# Patient Record
Sex: Female | Born: 1980 | Hispanic: Yes | Marital: Single | Smoking: Never smoker
Health system: Southern US, Community
[De-identification: ages and names within clinical notes are randomized; demographics above are authoritative.]

## PROBLEM LIST (undated history)

## (undated) ENCOUNTER — Encounter

## (undated) ENCOUNTER — Ambulatory Visit

## (undated) ENCOUNTER — Ambulatory Visit: Payer: MEDICAID

## (undated) ENCOUNTER — Telehealth

## (undated) ENCOUNTER — Encounter: Attending: Internal Medicine | Primary: Internal Medicine

## (undated) ENCOUNTER — Ambulatory Visit
Attending: Rehabilitative and Restorative Service Providers" | Primary: Rehabilitative and Restorative Service Providers"

## (undated) ENCOUNTER — Encounter: Payer: MEDICARE | Attending: Obstetrics & Gynecology | Primary: Obstetrics & Gynecology

## (undated) ENCOUNTER — Encounter
Attending: Student in an Organized Health Care Education/Training Program | Primary: Student in an Organized Health Care Education/Training Program

## (undated) ENCOUNTER — Ambulatory Visit: Attending: Internal Medicine | Primary: Internal Medicine

## (undated) ENCOUNTER — Encounter: Payer: PRIVATE HEALTH INSURANCE | Attending: Obstetrics & Gynecology | Primary: Obstetrics & Gynecology

## (undated) DIAGNOSIS — R51 Headache: Secondary | ICD-10-CM

## (undated) DIAGNOSIS — F32A Depression, unspecified: Secondary | ICD-10-CM

## (undated) DIAGNOSIS — R519 Headache, unspecified: Secondary | ICD-10-CM

## (undated) DIAGNOSIS — O24419 Gestational diabetes mellitus in pregnancy, unspecified control: Secondary | ICD-10-CM

## (undated) DIAGNOSIS — F329 Major depressive disorder, single episode, unspecified: Secondary | ICD-10-CM

## (undated) HISTORY — DX: Gestational diabetes mellitus in pregnancy, unspecified control: O24.419

## (undated) HISTORY — PX: FRACTURE SURGERY: SHX138

## (undated) HISTORY — PX: ANKLE FRACTURE SURGERY: SHX122

---
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
## 2013-04-14 DIAGNOSIS — R000 Tachycardia, unspecified: Secondary | ICD-10-CM

## 2013-05-02 DIAGNOSIS — Z331 Pregnant state, incidental: Secondary | ICD-10-CM | POA: Insufficient documentation

## 2014-10-02 ENCOUNTER — Ambulatory Visit (INDEPENDENT_AMBULATORY_CARE_PROVIDER_SITE_OTHER): Payer: Medicaid Other | Admitting: Obstetrics and Gynecology

## 2014-10-02 VITALS — BP 100/64 | HR 85 | Wt 164.1 lb

## 2014-10-02 DIAGNOSIS — O219 Vomiting of pregnancy, unspecified: Secondary | ICD-10-CM

## 2014-10-02 DIAGNOSIS — N912 Amenorrhea, unspecified: Secondary | ICD-10-CM

## 2014-10-02 DIAGNOSIS — Z113 Encounter for screening for infections with a predominantly sexual mode of transmission: Secondary | ICD-10-CM

## 2014-10-02 DIAGNOSIS — Z3491 Encounter for supervision of normal pregnancy, unspecified, first trimester: Secondary | ICD-10-CM

## 2014-10-02 DIAGNOSIS — Z3687 Encounter for antenatal screening for uncertain dates: Secondary | ICD-10-CM

## 2014-10-02 LAB — POCT URINE PREGNANCY: Preg Test, Ur: POSITIVE — AB

## 2014-10-02 MED ORDER — DOXYLAMINE-PYRIDOXINE 10-10 MG PO TBEC: 1.0000 | DELAYED_RELEASE_TABLET | Freq: Four times a day (QID) | ORAL | Status: DC | PRN

## 2014-10-02 NOTE — Progress Notes (Signed)
Vic Blackbird for NOB nurse interview visit. G4  P1021-. Pt did not bring in her confirmation from [facility]. UPT done in office. (pos) Pregnancy education material explained and given. NOB labs ordered. HIV consent form signed. PNV encouraged. Drug screen obtained. NT declined for now will discus  with provider. Pt c/o of nausea. Diclegis rx sent in and instructions given. Pt is unsure of lmp. Dating scan ordered asap. Pt aware if she is greater than 10 weeks she will need to be sooner than 4 weeks.  Pt. To follow up with provider in 4 +/- weeks for NOB physical.  All questions answered.

## 2014-10-02 NOTE — Progress Notes (Signed)
ZIKA EXPOSURE SCREEN:  The patient has not traveled to a Zika Virus endemic area within the past 6 months, nor has she had unprotected sex with a partner who has travelled to a Zika endemic region within the past 6 months. The patient has been advised to notify us if these factors change any time during this current pregnancy, so adequate testing and monitoring can be initiated.  

## 2014-10-04 LAB — URINALYSIS, ROUTINE W REFLEX MICROSCOPIC
Bilirubin, UA: NEGATIVE
Glucose, UA: NEGATIVE
KETONES UA: NEGATIVE
Leukocytes, UA: NEGATIVE
Nitrite, UA: NEGATIVE
PROTEIN UA: NEGATIVE
RBC, UA: NEGATIVE
Specific Gravity, UA: 1.025 (ref 1.005–1.030)
UUROB: 0.2 mg/dL (ref 0.2–1.0)
pH, UA: 6 (ref 5.0–7.5)

## 2014-10-04 LAB — GC/CHLAMYDIA PROBE AMP
Chlamydia trachomatis, NAA: NEGATIVE
NEISSERIA GONORRHOEAE BY PCR: NEGATIVE

## 2014-10-05 LAB — URINE CULTURE

## 2014-10-06 ENCOUNTER — Ambulatory Visit: Payer: Medicaid Other

## 2014-10-06 ENCOUNTER — Other Ambulatory Visit: Payer: Medicaid Other

## 2014-10-06 DIAGNOSIS — Z36 Encounter for antenatal screening of mother: Secondary | ICD-10-CM | POA: Diagnosis not present

## 2014-10-07 LAB — CBC WITH DIFFERENTIAL/PLATELET
BASOS ABS: 0 10*3/uL (ref 0.0–0.2)
BASOS: 0 %
EOS (ABSOLUTE): 0.1 10*3/uL (ref 0.0–0.4)
EOS: 1 %
Hematocrit: 37.9 % (ref 34.0–46.6)
Hemoglobin: 12.9 g/dL (ref 11.1–15.9)
Immature Grans (Abs): 0 10*3/uL (ref 0.0–0.1)
Immature Granulocytes: 0 %
Lymphocytes Absolute: 2.1 10*3/uL (ref 0.7–3.1)
Lymphs: 25 %
MCH: 30.2 pg (ref 26.6–33.0)
MCHC: 34 g/dL (ref 31.5–35.7)
MCV: 89 fL (ref 79–97)
MONOCYTES: 11 %
Monocytes Absolute: 1 10*3/uL — ABNORMAL HIGH (ref 0.1–0.9)
Neutrophils Absolute: 5.5 10*3/uL (ref 1.4–7.0)
Neutrophils: 63 %
Platelets: 230 10*3/uL (ref 150–379)
RBC: 4.27 x10E6/uL (ref 3.77–5.28)
RDW: 14.3 % (ref 12.3–15.4)
WBC: 8.7 10*3/uL (ref 3.4–10.8)

## 2014-10-07 LAB — VARICELLA ZOSTER ANTIBODY, IGG: Varicella zoster IgG: 1520 index (ref >165)

## 2014-10-07 LAB — HIV ANTIBODY (ROUTINE TESTING W REFLEX): HIV Screen 4th Generation wRfx: NONREACTIVE

## 2014-10-07 LAB — ANTIBODY SCREEN: ANTIBODY SCREEN: NEGATIVE

## 2014-10-07 LAB — RUBELLA SCREEN: Rubella Antibodies, IGG: 2.67 index (ref >0.99)

## 2014-10-07 LAB — RPR: RPR: NONREACTIVE

## 2014-10-07 LAB — ABO AND RH: RH TYPE: POSITIVE

## 2014-10-07 LAB — HEPATITIS B SURFACE ANTIGEN: HEP B S AG: NEGATIVE

## 2014-10-09 ENCOUNTER — Encounter: Payer: Self-pay | Admitting: Obstetrics and Gynecology

## 2014-10-21 ENCOUNTER — Encounter: Payer: Self-pay | Admitting: Obstetrics and Gynecology

## 2014-10-21 NOTE — Progress Notes (Signed)
I agree with assessment and documentation as noted .   Hildred LaserAnika Marieke Lubke, MD [facility]

## 2014-11-09 ENCOUNTER — Encounter: Payer: Medicaid Other | Admitting: Obstetrics and Gynecology

## 2014-11-17 ENCOUNTER — Ambulatory Visit (INDEPENDENT_AMBULATORY_CARE_PROVIDER_SITE_OTHER): Payer: Medicaid Other | Admitting: Obstetrics and Gynecology

## 2014-11-17 ENCOUNTER — Other Ambulatory Visit: Payer: Self-pay | Admitting: *Deleted

## 2014-11-17 ENCOUNTER — Encounter: Payer: Self-pay | Admitting: Obstetrics and Gynecology

## 2014-11-17 VITALS — BP 99/66 | HR 87 | Ht 60.0 in | Wt 168.0 lb

## 2014-11-17 DIAGNOSIS — O09529 Supervision of elderly multigravida, unspecified trimester: Secondary | ICD-10-CM | POA: Insufficient documentation

## 2014-11-17 DIAGNOSIS — O99210 Obesity complicating pregnancy, unspecified trimester: Secondary | ICD-10-CM | POA: Insufficient documentation

## 2014-11-17 DIAGNOSIS — O09291 Supervision of pregnancy with other poor reproductive or obstetric history, first trimester: Secondary | ICD-10-CM | POA: Diagnosis not present

## 2014-11-17 DIAGNOSIS — O34219 Maternal care for unspecified type scar from previous cesarean delivery: Secondary | ICD-10-CM

## 2014-11-17 DIAGNOSIS — Z1389 Encounter for screening for other disorder: Secondary | ICD-10-CM

## 2014-11-17 DIAGNOSIS — O09299 Supervision of pregnancy with other poor reproductive or obstetric history, unspecified trimester: Secondary | ICD-10-CM | POA: Insufficient documentation

## 2014-11-17 DIAGNOSIS — O3421 Maternal care for scar from previous cesarean delivery: Secondary | ICD-10-CM

## 2014-11-17 DIAGNOSIS — O09521 Supervision of elderly multigravida, first trimester: Secondary | ICD-10-CM

## 2014-11-17 LAB — POCT URINALYSIS DIPSTICK
BILIRUBIN UA: NEGATIVE
Blood, UA: NEGATIVE
Glucose, UA: NEGATIVE
KETONES UA: NEGATIVE
Leukocytes, UA: NEGATIVE
Nitrite, UA: NEGATIVE
Protein, UA: NEGATIVE
Spec Grav, UA: 1.015
Urobilinogen, UA: NEGATIVE
pH, UA: 6.5

## 2014-11-17 MED ORDER — COMPLETENATE 29-1 MG PO CHEW: 1.0000 | CHEWABLE_TABLET | Freq: Every day | ORAL | Status: DC

## 2014-11-17 NOTE — Progress Notes (Signed)
Subjective:    Stacy Best is being seen today for her first obstetrical visit.  This is a planned pregnancy. She is at [redacted]w[redacted]d gestation. Patient's last menstrual period was 08/20/2014 (approximate).  Estimated Date of Delivery: 05/27/15 (consistent with 6 week sono).  Her obstetrical history is significant for advanced maternal age, obesity and history of prior C-section. Relationship with FOB: spouse, living together. Patient does intend to breast feed. Pregnancy history fully reviewed.  Menstrual History: Obstetric History   G4   P1   T1   P0   A2   TAB1   SAB1   E0   M0   L1     # Outcome Date GA Lbr Len/2nd Weight Sex Delivery Anes PTL Lv  4 Current           3 Term 2015   9 lb (4.082 kg) F CS-Unspec   Y     Name: [name]     Complications: Tachycardia  2 TAB 2003          1 SAB 2000             Reports normal pap smear in 2015 with last pregnancy.  Denies h/o abnormal pap smears. Denies h/o STIs.    Past Medical History  Diagnosis Date  . Medical history non-contributory   . Need for Tdap vaccination     28 weeks    Past Surgical History  Procedure Laterality Date  . Cesarean section  2015  . Ankle fracture surgery      Family History  Problem Relation Age of Onset  . Breast cancer Mother 43  . Diabetes Father   . Ovarian cancer Neg Hx   . Colon cancer Neg Hx   . Heart disease Neg Hx     History   Social History  . Marital Status: Single    Spouse Name: N/A  . Number of Children: N/A  . Years of Education: N/A   Occupational History  . Not on file.   Social History Main Topics  . Smoking status: Never Smoker   . Smokeless tobacco: Not on file  . Alcohol Use: No  . Drug Use: No  . Sexual Activity: Yes   Other Topics Concern  . Not on file   Social History Narrative    Outpatient Encounter Prescriptions as of 11/17/2014  Medication Sig  . Prenatal vitamin w/FE, FA (NATACHEW) 29-1 MG CHEW chewable tablet Chew 1 tablet by mouth daily at 12 noon.     No Known Allergies   Review of Systems General:Not Present- Fever, Weight Loss and Weight Gain. Skin:Not Present- Rash. HEENT:Not Present- Blurred Vision, Headache and Bleeding Gums. Respiratory:Not Present- Difficulty Breathing. Breast:Not Present- Breast Mass. Cardiovascular:Not Present- Chest Pain, Elevated Blood Pressure, Fainting / Blacking Out and Shortness of Breath. Gastrointestinal:Not Present- Abdominal Pain, Constipation, Nausea and Vomiting. Female Genitourinary:Not Present- Frequency, Painful Urination, Pelvic Pain, Vaginal Bleeding, Vaginal Discharge, Contractions, regular, Fetal Movements Decreased, Urinary Complaints and Vaginal Fluid. Musculoskeletal:Not Present- Back Pain and Leg Cramps. Neurological:Not Present- Dizziness. Psychiatric:Not Present- Depression.    Objective:   Blood pressure 99/66, pulse 87, height 5' (1.524 m), weight 168 lb (76.204 kg), last menstrual period 08/20/2014.   General Appearance:    Alert, cooperative, no distress, appears stated age  Head:    Normocephalic, without obvious abnormality, atraumatic  Eyes:    PERRL, conjunctiva/corneas clear, EOM's intact, both eyes  Ears:    Normal external ear canals, both ears  Nose:  Nares normal, septum midline, mucosa normal, no drainage or sinus tenderness  Throat:   Lips, mucosa, and tongue normal; teeth and gums normal  Neck:   Supple, symmetrical, trachea midline, no adenopathy; thyroid: no enlargement/tenderness/nodules; no       carotid bruit or JVD  Back:     Symmetric, no curvature, ROM normal, no CVA tenderness  Lungs:     Clear to auscultation bilaterally, respirations unlabored  Chest Wall:    No tenderness or deformity   Heart:    Regular rate and rhythm, S1 and S2 normal, no murmur, rub or gallop  Breast Exam:    No tenderness, masses, or nipple abnormality  Abdomen:     Soft, non-tender, bowel sounds active all four quadrants, no masses, no organomegaly.  FHT 156 bpm.   Faint low transverse incision scar.  Genitalia:    Pelvic:external genitalia normal, vagina with lesions, discharge, or tenderness, rectovaginal septum       normal. Cervix normal in appearance, no cervical motion tenderness, no adnexal masses or tenderness.     Pregnancy positive findings: uterine enlargement: 12 wk size, nontender.   Rectal:    Normal external sphincter.  No hemorrhoids appreciated. Internal exam not done.   Extremities:   Extremities normal, atraumatic, no cyanosis or edema  Pulses:   2+ and symmetric all extremities  Skin:   Skin color, texture, turgor normal, no rashes or lesions  Lymph nodes:   Cervical, supraclavicular, and axillary nodes normal  Neurologic:   CNII-XII intact, normal strength, sensation and reflexes throughout     Assessment:   Pregnancy at 12 and 5/7 weeks   Obesity H/o prior C-section x 1 Advanced maternal age   Plan:    Initial labs reviewed, normal. Prenatal vitamins. Problem list reviewed and updated. AFP3 discussed: undecided. Role of ultrasound in pregnancy discussed; fetal survey: to order at appropriate gestational age. Will need early glucola for obesity.  Discussed repeat C-section vs TOLAC, including patient's risks, risk factors.  Patient desires to to think over options.  Follow up in 4 weeks. New OB counseling: The patient has been given an overview regarding routine prenatal care.  Recommendations regarding diet, weight gain, and exercise in pregnancy were given.  Prenatal testing, optional genetic testing, and ultrasound use in pregnancy were reviewed. Benefits of Breast Feeding were discussed. The patient is encouraged to consider nursing her baby post partum.   Hildred Laser, MD [facility]

## 2014-12-20 ENCOUNTER — Ambulatory Visit (INDEPENDENT_AMBULATORY_CARE_PROVIDER_SITE_OTHER): Payer: Medicaid Other | Admitting: Obstetrics and Gynecology

## 2014-12-20 VITALS — BP 89/58 | HR 79 | Wt 170.2 lb

## 2014-12-20 DIAGNOSIS — O3421 Maternal care for scar from previous cesarean delivery: Secondary | ICD-10-CM

## 2014-12-20 DIAGNOSIS — Z3402 Encounter for supervision of normal first pregnancy, second trimester: Secondary | ICD-10-CM

## 2014-12-20 DIAGNOSIS — Z131 Encounter for screening for diabetes mellitus: Secondary | ICD-10-CM

## 2014-12-20 DIAGNOSIS — O34219 Maternal care for unspecified type scar from previous cesarean delivery: Secondary | ICD-10-CM

## 2014-12-20 DIAGNOSIS — O99210 Obesity complicating pregnancy, unspecified trimester: Secondary | ICD-10-CM

## 2014-12-20 DIAGNOSIS — O09522 Supervision of elderly multigravida, second trimester: Secondary | ICD-10-CM

## 2014-12-20 LAB — POCT URINALYSIS DIPSTICK
Bilirubin, UA: NEGATIVE
Blood, UA: NEGATIVE
GLUCOSE UA: NEGATIVE
Ketones, UA: NEGATIVE
NITRITE UA: NEGATIVE
SPEC GRAV UA: 1.01
Urobilinogen, UA: NEGATIVE
pH, UA: 7.5

## 2014-12-20 NOTE — Progress Notes (Signed)
ROB: Patient complains of occasional difficulty staying asleep, feels tired during the day. Discussed herbal teas, Benadryl, Unisom. Undecided regarding flu vaccine today. Still undecided about TOLAC vs repeat CS.  To discuss again next visit.  For quad screen and early glucola today.  RTC in 4 weeks. For anatomy scan at that time.

## 2014-12-21 LAB — HEMOGLOBIN AND HEMATOCRIT, BLOOD
Hematocrit: 35.7 % (ref 34.0–46.6)
Hemoglobin: 12.2 g/dL (ref 11.1–15.9)

## 2014-12-21 LAB — GLUCOSE, 1 HOUR GESTATIONAL: Gestational Diabetes Screen: 156 mg/dL — ABNORMAL HIGH (ref 65–139)

## 2014-12-22 ENCOUNTER — Other Ambulatory Visit: Payer: Self-pay

## 2014-12-22 ENCOUNTER — Telehealth: Payer: Self-pay

## 2014-12-22 DIAGNOSIS — Z3402 Encounter for supervision of normal first pregnancy, second trimester: Secondary | ICD-10-CM

## 2014-12-22 DIAGNOSIS — R7309 Other abnormal glucose: Secondary | ICD-10-CM

## 2014-12-22 NOTE — Telephone Encounter (Signed)
-----   Message from Hildred Laser, MD sent at 12/21/2014  3:11 PM EDT ----- Please inform patient that early glucola screen is elevated, needs 3 hr GTT

## 2014-12-22 NOTE — Telephone Encounter (Signed)
-----   Message from Stacy Cherry, MD sent at 12/21/2014  3:11 PM EDT ----- Please inform patient that early glucola screen is elevated, needs 3 hr GTT 

## 2014-12-22 NOTE — Telephone Encounter (Signed)
Pt informed, and transferred to the front to make appt on the lab schedule.

## 2014-12-27 ENCOUNTER — Other Ambulatory Visit: Payer: Medicaid Other

## 2014-12-28 LAB — GESTATIONAL GLUCOSE TOLERANCE
GLUCOSE 1 HOUR GTT: 205 mg/dL — AB (ref 65–179)
GLUCOSE 2 HOUR GTT: 185 mg/dL — AB (ref 65–154)
GLUCOSE FASTING: 90 mg/dL (ref 65–94)
Glucose, GTT - 3 Hour: 130 mg/dL (ref 65–139)

## 2015-01-01 ENCOUNTER — Telehealth: Payer: Self-pay

## 2015-01-01 DIAGNOSIS — R7309 Other abnormal glucose: Secondary | ICD-10-CM

## 2015-01-01 NOTE — Telephone Encounter (Signed)
-----   Message from Hildred Laser, MD sent at 01/01/2015  7:28 AM EDT ----- Please inform patient of abnormal 3 hr GTT.  Needs referral to [facility] for Diabetes management.

## 2015-01-01 NOTE — Telephone Encounter (Signed)
Pt aware. Referral ordered.  

## 2015-01-09 ENCOUNTER — Encounter: Payer: Self-pay | Admitting: *Deleted

## 2015-01-09 ENCOUNTER — Encounter: Payer: Medicaid Other | Attending: Obstetrics and Gynecology | Admitting: *Deleted

## 2015-01-09 VITALS — BP 90/60 | Ht 60.0 in | Wt 173.6 lb

## 2015-01-09 DIAGNOSIS — O24419 Gestational diabetes mellitus in pregnancy, unspecified control: Secondary | ICD-10-CM | POA: Diagnosis not present

## 2015-01-09 DIAGNOSIS — O24410 Gestational diabetes mellitus in pregnancy, diet controlled: Secondary | ICD-10-CM

## 2015-01-10 ENCOUNTER — Other Ambulatory Visit: Payer: Self-pay | Admitting: *Deleted

## 2015-01-10 ENCOUNTER — Telehealth: Payer: Self-pay | Admitting: Obstetrics and Gynecology

## 2015-01-10 MED ORDER — GLUCOSE BLOOD VI STRP: ORAL_STRIP | Status: DC

## 2015-01-10 MED ORDER — ACCU-CHEK FASTCLIX LANCETS MISC: 1.0000 | Freq: Four times a day (QID) | Status: DC

## 2015-01-10 NOTE — Patient Instructions (Signed)
Read booklet on Gestational Diabetes Follow Gestational Meal Planning Guidelines Complete a 3 Day Food Record and bring to next appointment Check blood sugars 4 x day - before breakfast and 2 hrs after every meal and record  Call MD for prescription for meter strips and lancets Strips   Accu-Chek Aviva Lancets   Accu-Chek Fastclix Bring blood sugar log to next appointment Purchase urine ketone strips if blood sugars not controlled Check urine ketones every am:  If + increase bedtime snack to 1 protein and 2 carbohydrate servings Walk 20-30 minutes at least 5 x week if permitted by MD Avoid sugar sweetened beverages Limit desserts/sweets

## 2015-01-10 NOTE — Progress Notes (Signed)
Diabetes Self-Management Education  Visit Type: First/Initial  Appt. Start Time: 1445 Appt. End Time: 1630  01/10/2015  [name], identified by name and date of birth, is a 34 y.o. female with a diagnosis of Diabetes: Gestational Diabetes.   ASSESSMENT  Blood pressure 90/60, height 5' (1.524 m), weight 173 lb 9.6 oz (78.744 kg), last menstrual period 08/20/2014. Body mass index is 33.9 kg/(m^2).      Diabetes Self-Management Education - 01/09/15 1736    Visit Information   Visit Type First/Initial   Initial Visit   Diabetes Type Gestational Diabetes   Are you currently following a meal plan? Yes   What type of meal plan do you follow? "eat more since my pregnancy"   Are you taking your medications as prescribed? Yes   Date Diagnosed 2 weeks ago   Health Coping   How would you rate your overall health? Fair   Psychosocial Assessment   Patient Belief/Attitude about Diabetes Afraid  "scary, nervous"   Self-care barriers None   Self-management support Doctor's office;Family   Patient Concerns Nutrition/Meal planning;Monitoring;Glycemic Control;Other (comment);Healthy Lifestyle;Weight Control  "feel better emotionally - self esteem"   Special Needs None   Preferred Learning Style Auditory;Visual;Hands on   Learning Readiness Ready   How often do you need to have someone help you when you read instructions, pamphlets, or other written materials from your doctor or pharmacy? 1 - Never   What is the last grade level you completed in school? [employer]   Complications   How often do you check your blood sugar? 0 times/day (not testing)  Provided Accu-Chek Aviva Connect meter and instructed on use. BG upon return demonstration was 104 mg/dL at 0:98 pm - 3 hrs pp.   Have you had a dilated eye exam in the past 12 months? No   Have you had a dental exam in the past 12 months? No   Are you checking your feet? No   Dietary Intake   Breakfast oatmeal, shake, bread - bagel,  biscuit or muffin   Snack (morning) fruit, bread   Lunch meat, rice   Snack (afternoon) nuts, ice cream   Dinner meat, rice, soup   Snack (evening) milk   Beverage(s) water, regular soda   Exercise   Exercise Type ADL's   Patient Education   Previous Diabetes Education No   Disease state  Definition of diabetes, type 1 and 2, and the diagnosis of diabetes;Factors that contribute to the development of diabetes   Nutrition management  Role of diet in the treatment of diabetes and the relationship between the three main macronutrients and blood glucose level;Food label reading, portion sizes and measuring food.   Physical activity and exercise  Role of exercise on diabetes management, blood pressure control and cardiac health.   Monitoring Taught/evaluated SMBG meter.;Purpose and frequency of SMBG.;Identified appropriate SMBG and/or A1C goals.   Chronic complications Relationship between chronic complications and blood glucose control   Psychosocial adjustment Identified and addressed patients feelings and concerns about diabetes   Preconception care Pregnancy and GDM  Role of pre-pregnancy blood glucose control on the development of the fetus;Reviewed with patient blood glucose goals with pregnancy   Individualized Goals (developed by patient)   Reducing Risk Improve blood sugars Lose weight Lead a healthier lifestyle Become more fit Feel better emotionally - self esteem   Outcomes   Expected Outcomes Demonstrated interest in learning. Expect positive outcomes      Individualized Plan for Diabetes Self-Management  Training:   Learning Objective:  Patient will have a greater understanding of diabetes self-management. Patient education plan is to attend individual and/or group sessions per assessed needs and concerns.   Plan:   Patient Instructions  Read booklet on Gestational Diabetes Follow Gestational Meal Planning Guidelines Complete a 3 Day Food Record and bring to next  appointment Check blood sugars 4 x day - before breakfast and 2 hrs after every meal and record  Call MD for prescription for meter strips and lancets Strips   Accu-Chek Aviva Lancets   Accu-Chek Fastclix Bring blood sugar log to next appointment Purchase urine ketone strips if blood sugars not controlled Check urine ketones every am:  If + increase bedtime snack to 1 protein and 2 carbohydrate servings Walk 20-30 minutes at least 5 x week if permitted by MD Avoid sugar sweetened beverages Limit desserts/sweets   Expected Outcomes:  Demonstrated interest in learning. Expect positive outcomes  Education material provided:  Gestational Meal Planning Guidelines Viewed Gestational Diabetes Video Meter - Accu-Chek Aviva Connect 3 Day Food Record Goals for a Healthy Pregnancy  If problems or questions, patient to contact team via:   Sharion Settler, RN, CCM, CDE [phone]  Future DSME appointment:  January 26, 2015 with Dietitian

## 2015-01-10 NOTE — Telephone Encounter (Signed)
THIS IS A CHERRY PT, SHE SAW DIETICIAN YESTERDAY AND WAS GIVE AN ACCU CHECK GLUCOMETER, SHE NEEDS STRIPS AND LANCETS SENT TO [facility] [place], ALSO SAID IF SHE COULD GET ALCOHOL WIPES AND GUAZE BY PRESCRIPTION PLEASE DO THAT TOO.

## 2015-01-10 NOTE — Telephone Encounter (Signed)
rx sent in 

## 2015-01-16 ENCOUNTER — Telehealth: Payer: Self-pay | Admitting: Obstetrics and Gynecology

## 2015-01-16 NOTE — Telephone Encounter (Signed)
PT THINKS SHE MAY HAVE AN INF... LOT OF SCHARGE, BURNING , ITCHING, WANTED AN APPT TODAY BUT THERE IS NOTHING UNTIL Thursday, SHE SAID SHE A NOTE.

## 2015-01-16 NOTE — Telephone Encounter (Signed)
Pt also has vaginal odor. Just left pcp and was found to have BV and Metrodiaole  po 2xd, x7d. Pt states she had let this go and knew we were busy. Very pleasant.

## 2015-01-18 ENCOUNTER — Encounter: Payer: Medicaid Other | Attending: Obstetrics and Gynecology | Admitting: Dietician

## 2015-01-18 VITALS — BP 98/50 | Ht 60.0 in | Wt 172.5 lb

## 2015-01-18 DIAGNOSIS — O24410 Gestational diabetes mellitus in pregnancy, diet controlled: Secondary | ICD-10-CM

## 2015-01-18 DIAGNOSIS — O24419 Gestational diabetes mellitus in pregnancy, unspecified control: Secondary | ICD-10-CM | POA: Diagnosis present

## 2015-01-18 NOTE — Patient Instructions (Signed)
   Advised patient to include protein source with all meals, and discussed options.  Advised no more than 3 servings or 45g carbohydrate with meals.   Encouraged patient to proceed with plans for exercise as means for helping improve BGs.

## 2015-01-18 NOTE — Progress Notes (Signed)
   Patient's BG record indicates BGs often elevated: FBGs ranging 98-116; post-meal BGs 101-167.   Patient's food diary indicates several fast food/ restaurant meals, which patient states is out of the ordinary.    Provided 1700kcal meal plan with instruction, and wrote individualized menus based on patient's food preferences.  Instructed patient on food safety, including avoidance of Listeriosis, and limiting mercury from fish.  Discussed importance of maintaining healthy lifestyle habits to reduce risk of Type 2 DM as well as Gestational DM with any future pregnancies.  Advised patient to use any remaining testing supplies to test some BGs after delivery, and to have BG tested ideally annually, as well as prior to attempting future pregnancies.

## 2015-01-19 ENCOUNTER — Ambulatory Visit: Payer: Medicaid Other

## 2015-01-19 DIAGNOSIS — Z3402 Encounter for supervision of normal first pregnancy, second trimester: Secondary | ICD-10-CM | POA: Diagnosis not present

## 2015-01-23 ENCOUNTER — Ambulatory Visit (INDEPENDENT_AMBULATORY_CARE_PROVIDER_SITE_OTHER): Payer: Medicaid Other | Admitting: Obstetrics and Gynecology

## 2015-01-23 VITALS — BP 113/57 | HR 87 | Wt 174.5 lb

## 2015-01-23 DIAGNOSIS — O09522 Supervision of elderly multigravida, second trimester: Secondary | ICD-10-CM

## 2015-01-23 DIAGNOSIS — O99210 Obesity complicating pregnancy, unspecified trimester: Secondary | ICD-10-CM

## 2015-01-23 DIAGNOSIS — R809 Proteinuria, unspecified: Secondary | ICD-10-CM

## 2015-01-23 DIAGNOSIS — O34219 Maternal care for unspecified type scar from previous cesarean delivery: Secondary | ICD-10-CM

## 2015-01-23 DIAGNOSIS — O24419 Gestational diabetes mellitus in pregnancy, unspecified control: Secondary | ICD-10-CM

## 2015-01-23 DIAGNOSIS — Z3402 Encounter for supervision of normal first pregnancy, second trimester: Secondary | ICD-10-CM

## 2015-01-23 LAB — POCT URINALYSIS DIP (MANUAL ENTRY)
BILIRUBIN UA: NEGATIVE
Glucose, UA: NEGATIVE
Nitrite, UA: NEGATIVE
PH UA: 6
RBC UA: NEGATIVE
UROBILINOGEN UA: 0.2

## 2015-01-23 MED ORDER — FLUCONAZOLE 150 MG PO TABS: 150.0000 mg | ORAL_TABLET | Freq: Once | ORAL | Status: DC

## 2015-01-23 MED ORDER — PRENATAL MULTIVITAMIN PLUS DHA 27-0.8-250 MG PO CAPS: 1.0000 | ORAL_CAPSULE | Freq: Every day | ORAL | Status: DC

## 2015-01-23 NOTE — Patient Instructions (Addendum)
Trial of Labor After Cesarean Delivery Information A trial of labor after cesarean delivery (TOLAC) is when a woman tries to give birth vaginally after a previous cesarean delivery. TOLAC may be a safe and appropriate option for you depending on your medical history and other risk factors. When TOLAC is successful and you are able to have a vaginal delivery, this is called a vaginal birth after cesarean delivery (VBAC).  CANDIDATES FOR TOLAC TOLAC is possible for some women who:  Have undergone one or two prior cesarean deliveries in which the incision of the uterus was horizontal (low transverse).  Are carrying twins and have had one prior low transverse incision during a cesarean delivery.  Do not have a vertical (classical) uterine scar.  Have not had a tear in the wall of their uterus (uterine rupture). TOLAC is also supported for women who meet appropriate criteria and:  Are under the age of 40 years.  Are tall and have a body mass index (BMI) of less than 30.  Have an unknown uterine scar.  Give birth in a facility equipped to handle an emergency cesarean delivery. This team should be able to handle possible complications such as a uterine rupture.  Have thorough counseling about the benefits and risks of TOLAC.  Have discussed future pregnancy plans with their health care provider.  Plan to have several more pregnancies. MOST SUCCESSFUL CANDIDATES FOR TOLAC:  Have had a successful vaginal delivery before or after their cesarean delivery.  Experience labor that begins naturally on or before the due date (40 weeks of gestation).  Do not have a very large (macrosomic) baby.   Had a prior cesarean delivery but are not currently experiencing factors that would prompt a cesarean delivery (such as a breech position).  Had only one prior cesarean delivery.  Had a prior cesarean delivery that was performed early in labor and not after full cervical dilation. TOLAC may be most  appropriate for women who meet the above guidelines and who plan to have more pregnancies. TOLAC is not recommended for home births. LEAST SUCCESSFUL CANDIDATES FOR TOLAC:  Have an induced labor with an unfavorable cervix. An unfavorable cervix is when the cervix is not dilating enough (among other factors).  Have never had a vaginal delivery.  Have had more than two cesarean deliveries.  Have a pregnancy at more than 40 weeks of gestation.  Are pregnant with a baby with a suspected weight greater than 4,000 grams (8 pounds) and who have no prior history of a vaginal delivery.  Have closely spaced pregnancies. SUGGESTED BENEFITS OF TOLAC  You may have a faster recovery time.  You may have a shorter stay in the hospital.  You may have less pain and fewer problems than with a cesarean delivery. Women who have a cesarean delivery have a higher chance of needing blood or getting a fever, an infection, or a blood clot in the legs. SUGGESTED RISKS OF TOLAC The highest risk of complications happens to women who attempt a TOLAC and fail. A failed TOLAC results in an unplanned cesarean delivery. Risks related to TOLAC or repeat cesarean deliveries include:   Blood loss.  Infection.  Blood clot.  Injury to surrounding tissues or organs.  Having to remove the uterus (hysterectomy).  Potential problems with the placenta (such as placenta previa or placenta accreta) in future pregnancies. Although very rare, the main concerns with TOLAC are:  Rupture of the uterine scar from a past cesarean delivery.  Needing an   emergency cesarean delivery.  Having a bad outcome for the baby (perinatal morbidity). FOR MORE INFORMATION [employer] of Obstetricians and Gynecologists: www.acog.org [employer]: www.midwife.org   This information is not intended to replace advice given to you by your health care provider. Make sure you discuss any questions you have with  your health care provider.   Document Released: 12/17/2010 Document Revised: 01/19/2013 Document Reviewed: 09/20/2012 Elsevier Interactive Patient Education [employer] Cesarean Delivery Cesarean delivery is the birth of a baby through a cut (incision) in the abdomen and womb (uterus).  LET [facility] CARE PROVIDER KNOW ABOUT:  All medicines you are taking, including vitamins, herbs, eye drops, creams, and over-the-counter medicines.  Previous problems you or members of your family have had with the use of anesthetics.  Any bleeding or blood clotting disorders you have.  Family history of blood clots or bleeding disorders.  Any history of deep vein thrombosis (DVT) or pulmonary embolism (PE).  Previous surgeries you have had.  Medical conditions you have.  Any allergies you have.  Complicationsinvolving the pregnancy. RISKS AND COMPLICATIONS  Generally, this is a safe procedure. However, as with any procedure, complications can occur. Possible complications include:  Bleeding.  Infection.  Blood clots.  Injury to surrounding organs.  Problems with anesthesia.  Injury to the baby. BEFORE THE PROCEDURE   You may be given an antacid medicine to drink. This will prevent acid contents in your stomach from going into your lungs if you vomit during the surgery.  You may be given an antibiotic medicine to prevent infection. PROCEDURE   To prevent infection of your incision:  Hair may be removed from your pubic area if it is near your incision.  The skin of your pubic area and lower abdomen will be cleaned with a germ-killing solution (antiseptic).  A tube (Foley catheter) will be placed in your bladder to drain your urine from your bladder into a bag. This keeps your bladder empty during surgery.  An IV tube will be placed in your vein.  You may be given medicine to numb the lower half of your body (regional anesthetic). If you were in labor, you may have  already had an epidural in place which can be used in both labor and cesarean delivery. You may possibly be given medicine to make you sleep (general anesthetic) though this is not as common.  Your heart rate and your baby's heart rate will be monitored.  An incision will be made in your abdomen that extends to your uterus. There are 2 basic kinds of incisions:  The horizontal (transverse) incision. Horizontal incisions are from side to side and are used for most routine cesarean deliveries.  The vertical incision. The vertical incision is from the top of the abdomen to the bottom and is less commonly used. It is often done for women who have a serious complication (extreme prematurity) or under emergency situations.  The horizontal and vertical incisions may both be used at the same time. However, this is very uncommon.  An incision is then made in your uterus to deliver the baby.  Your baby will be delivered.  Your health care provider may place the baby on your chest. It is important to keep the baby warm. Your health care provider will dry off the baby, place the baby directly on your bare skin, and cover the baby with warm, dry blankets.  Both incisions will be closed with absorbable stitches. AFTER THE PROCEDURE  If you were awake during the surgery, you will see your baby right away. If you were asleep, you will see your baby as soon as you are awake.  You may breastfeed your baby after surgery.  You may be able to get up and walk the same day as the surgery. If you need to stay in bed for a period of time, you will receive help to turn, cough, and take deep breaths after surgery. This helps prevent lung problems such as pneumonia.  Do not get out of bed alone the first time after surgery. You will need help getting out of bed until you are able to do this by yourself.  You may be able to shower the day after your cesarean delivery. After the bandage (dressing) is taken off the  incision site, a nurse will assist you to shower if you would like help.  You may be directed to take actions to help prevent blood clots in your legs. These may include:  Walking shortly after surgery, with someone assisting you. Moving around after surgery helps to improve blood flow.  Wearing compression stockings or using different types of devices.  Taking medicines to thin your blood (anticoagulants) if you are at high risk for DVT or PE.  Save any blood clots that you pass from your vagina. If you pass a clot while on the toilet, do not flush it. Call for the nurse. Tell the nurse if you think you are bleeding too much or passing too many clots.  You will be given medicine for pain and nausea as needed. Let your health care providers know if you are hurting. You may also be given an antibiotic to prevent an infection.  Your IV tube will be taken out when you are drinking a reasonable amount of fluids. The Foley catheter is taken out when you are up and walking.  If your blood type is Rh negative and your baby's blood type is Rh positive, you will be given a shot of anti-D immune globulin. This shot prevents you from having Rh problems with a future pregnancy. You should get the shot even if you had your tubes tied (tubal ligation).  If you are allowed to take the baby for a walk, place the baby in the bassinet and push it.   This information is not intended to replace advice given to you by your health care provider. Make sure you discuss any questions you have with your health care provider.   Document Released: 03/31/2005 Document Revised: 12/20/2014 Document Reviewed: 11/26/2011 Elsevier Interactive Patient Education [employer]

## 2015-01-23 NOTE — Progress Notes (Signed)
ROB: Doing well. Taking medication for BV infection. Reports return of discharge after several days.  Will prescribe Diflucan.  Has been to [facility] class for newly diagnosed GDM.  Notes several elevated blood sugars, however is working on managing diet better (admits to "cheating" sometimes with high glycemic meals/snacks). Is going to begin working out. Further discussion had on TOLAC vs C-section, patient still deciding, questions answered today.  Requests another handout on both.  Information given. S/p anatomy scan with incomplete anatomy.  Will have patient f/u in 2-3 weeks for anatomy scan, and 4 weeks for ROB.  If blood sugars still elevated by that time, will likely need to initiate medication at that time.

## 2015-01-26 ENCOUNTER — Ambulatory Visit: Payer: Medicaid Other | Admitting: Dietician

## 2015-01-27 LAB — URINE CULTURE

## 2015-02-09 ENCOUNTER — Ambulatory Visit: Payer: Medicaid Other

## 2015-02-09 DIAGNOSIS — Z3402 Encounter for supervision of normal first pregnancy, second trimester: Secondary | ICD-10-CM | POA: Diagnosis not present

## 2015-02-20 ENCOUNTER — Encounter: Payer: Medicaid Other | Admitting: Obstetrics and Gynecology

## 2015-02-20 ENCOUNTER — Ambulatory Visit (INDEPENDENT_AMBULATORY_CARE_PROVIDER_SITE_OTHER): Payer: Medicaid Other | Admitting: Obstetrics and Gynecology

## 2015-02-20 ENCOUNTER — Encounter: Payer: Self-pay | Admitting: Obstetrics and Gynecology

## 2015-02-20 VITALS — BP 91/66 | HR 90 | Wt 177.3 lb

## 2015-02-20 DIAGNOSIS — O24419 Gestational diabetes mellitus in pregnancy, unspecified control: Secondary | ICD-10-CM

## 2015-02-20 DIAGNOSIS — O09522 Supervision of elderly multigravida, second trimester: Secondary | ICD-10-CM

## 2015-02-20 DIAGNOSIS — Z3492 Encounter for supervision of normal pregnancy, unspecified, second trimester: Secondary | ICD-10-CM

## 2015-02-20 DIAGNOSIS — O99210 Obesity complicating pregnancy, unspecified trimester: Secondary | ICD-10-CM

## 2015-02-20 DIAGNOSIS — O34219 Maternal care for unspecified type scar from previous cesarean delivery: Secondary | ICD-10-CM

## 2015-02-20 LAB — POCT URINALYSIS DIPSTICK
Bilirubin, UA: NEGATIVE
GLUCOSE UA: NEGATIVE
Ketones, UA: NEGATIVE
LEUKOCYTES UA: NEGATIVE
Nitrite, UA: NEGATIVE
Protein, UA: NEGATIVE
RBC UA: NEGATIVE
Spec Grav, UA: 1.02
UROBILINOGEN UA: 0.2
pH, UA: 6.5

## 2015-02-20 MED ORDER — GLYBURIDE 2.5 MG PO TABS: 2.5000 mg | ORAL_TABLET | Freq: Two times a day (BID) | ORAL | Status: DC

## 2015-02-20 NOTE — Progress Notes (Signed)
No complaints- see u/s report. BS- pt did not bring- bs log from [facility] reviewed by mad.Patient reports fasting blood sugars still running slightly over 100; two-hour postprandials are in the 120-140 range.  I have recommended patient to go on glyburide 2.5 mg twice a day.  Return in 1 week for blood sugar review with Dr. Valentino Saxonherry.

## 2015-02-28 ENCOUNTER — Ambulatory Visit (INDEPENDENT_AMBULATORY_CARE_PROVIDER_SITE_OTHER): Payer: Medicaid Other | Admitting: Obstetrics and Gynecology

## 2015-02-28 VITALS — BP 112/63 | HR 92 | Wt 179.1 lb

## 2015-02-28 DIAGNOSIS — O99210 Obesity complicating pregnancy, unspecified trimester: Secondary | ICD-10-CM

## 2015-02-28 DIAGNOSIS — O09522 Supervision of elderly multigravida, second trimester: Secondary | ICD-10-CM

## 2015-02-28 DIAGNOSIS — Z3492 Encounter for supervision of normal pregnancy, unspecified, second trimester: Secondary | ICD-10-CM

## 2015-02-28 DIAGNOSIS — O34219 Maternal care for unspecified type scar from previous cesarean delivery: Secondary | ICD-10-CM

## 2015-02-28 DIAGNOSIS — O24410 Gestational diabetes mellitus in pregnancy, diet controlled: Secondary | ICD-10-CM

## 2015-02-28 LAB — POCT URINALYSIS DIPSTICK
BILIRUBIN UA: NEGATIVE
Glucose, UA: NEGATIVE
Ketones, UA: NEGATIVE
Leukocytes, UA: NEGATIVE
NITRITE UA: NEGATIVE
PH UA: 6.5
RBC UA: NEGATIVE
Spec Grav, UA: 1.02
Urobilinogen, UA: NEGATIVE

## 2015-03-02 NOTE — Progress Notes (Signed)
ROB: Patient notes that she has taken medication for BV, however still noting symptoms (vaginal discharge).  Denies itching/burning.  Vaginal exam wnl, discharge present, no odor.  Microscopic wet-mount exam shows negative for pathogens, normal epithelial cells. Advised on likely leukorrhea of pregnancy. Attempted to review blood sugars, however patient inconsistent with taking measurements.  Started on Glyburide 2.5 mg BID.  Unable to determine if medication needs to be increased based on results.  Recommend better compliance.  For H/H, Tdap, blood consents next visit.  To revisit discussion on mode of delivery at next visit. RTC in 1 week.

## 2015-03-13 ENCOUNTER — Telehealth: Payer: Self-pay | Admitting: Obstetrics and Gynecology

## 2015-03-13 NOTE — Telephone Encounter (Signed)
Called pt she states that her stomach has been "acting weird" Pt states that she was having normal bowel movements and then suddenly stools became loose. Pt states she is not having bowel movements daily, but maybe every other day. Pt denies pain, and states she is still having normal urination. Pt denies cramping or contractions. Advised pt on BRAT diet, and the use of Kaopectate or Imodium as needed. Pt advised to stay hydrated. Pt to f/u on appt already scheduled for Friday.

## 2015-03-13 NOTE — Telephone Encounter (Signed)
Her digestion ahas not been normal for 2 wks. (diahhrea)

## 2015-03-15 ENCOUNTER — Ambulatory Visit (INDEPENDENT_AMBULATORY_CARE_PROVIDER_SITE_OTHER): Payer: Medicaid Other | Admitting: Obstetrics and Gynecology

## 2015-03-15 VITALS — BP 110/68 | HR 92 | Wt 177.7 lb

## 2015-03-15 DIAGNOSIS — O09522 Supervision of elderly multigravida, second trimester: Secondary | ICD-10-CM

## 2015-03-15 DIAGNOSIS — O24410 Gestational diabetes mellitus in pregnancy, diet controlled: Secondary | ICD-10-CM

## 2015-03-15 DIAGNOSIS — Z3403 Encounter for supervision of normal first pregnancy, third trimester: Secondary | ICD-10-CM

## 2015-03-15 DIAGNOSIS — O34219 Maternal care for unspecified type scar from previous cesarean delivery: Secondary | ICD-10-CM

## 2015-03-15 DIAGNOSIS — O99210 Obesity complicating pregnancy, unspecified trimester: Secondary | ICD-10-CM

## 2015-03-15 LAB — POCT URINALYSIS DIPSTICK
BILIRUBIN UA: NEGATIVE
GLUCOSE UA: 100
Ketones, UA: 15
Leukocytes, UA: NEGATIVE
Nitrite, UA: NEGATIVE
Protein, UA: 30
Urobilinogen, UA: NEGATIVE
pH, UA: 6

## 2015-03-15 MED ORDER — GLYBURIDE 5 MG PO TABS: 5.0000 mg | ORAL_TABLET | Freq: Two times a day (BID) | ORAL | Status: DC

## 2015-03-15 NOTE — Progress Notes (Signed)
ROB: Patient still notes vaginal discharge, continues to deny itching/burning.  Notes mild odor occasionally. Wet prep last visit normal.  Advised that if symptoms continue will send discharge for culture.  Blood sugars with fastings ranging 90s-110s, and postprandials 90s-170s.  Patient reports non-compliance with diet (still eating many processed sugars/carbs) and occasionally forgets to take her pills.  Instructed on better compliance  (strict adherence to diet, setting alarm in phone, getting pill box to keep near kitchen).  Refilled meds, but will increase dose to 5 mg BID (patient insists that she will try to have better compliance so will continue taking 2.5 mg BID x 1 week and monitor sugars, and if no improvement will increase to 5 mg BID).  F/u in 1 week for glucose log review, and 2 weeks for routine OB.  For H/H today.  For Tdap and blood consent next visit. Still undecided regarding mode of delivery (VBAC vs repeat C-section). To discuss again next visit.

## 2015-03-19 ENCOUNTER — Other Ambulatory Visit: Payer: Self-pay | Admitting: Obstetrics and Gynecology

## 2015-03-20 LAB — HEMOGLOBIN: Hemoglobin: 11.6 g/dL (ref 11.1–15.9)

## 2015-03-20 LAB — HEMATOCRIT: HEMATOCRIT: 36.2 % (ref 34.0–46.6)

## 2015-03-29 ENCOUNTER — Ambulatory Visit (INDEPENDENT_AMBULATORY_CARE_PROVIDER_SITE_OTHER): Payer: Medicaid Other | Admitting: Obstetrics and Gynecology

## 2015-03-29 ENCOUNTER — Encounter: Payer: Self-pay | Admitting: Obstetrics and Gynecology

## 2015-03-29 VITALS — BP 122/71 | HR 94 | Wt 180.0 lb

## 2015-03-29 DIAGNOSIS — O09522 Supervision of elderly multigravida, second trimester: Secondary | ICD-10-CM

## 2015-03-29 DIAGNOSIS — Z23 Encounter for immunization: Secondary | ICD-10-CM

## 2015-03-29 DIAGNOSIS — O34219 Maternal care for unspecified type scar from previous cesarean delivery: Secondary | ICD-10-CM

## 2015-03-29 DIAGNOSIS — Z3493 Encounter for supervision of normal pregnancy, unspecified, third trimester: Secondary | ICD-10-CM

## 2015-03-29 DIAGNOSIS — Z3483 Encounter for supervision of other normal pregnancy, third trimester: Secondary | ICD-10-CM | POA: Diagnosis not present

## 2015-03-29 DIAGNOSIS — O99210 Obesity complicating pregnancy, unspecified trimester: Secondary | ICD-10-CM

## 2015-03-29 DIAGNOSIS — O24419 Gestational diabetes mellitus in pregnancy, unspecified control: Secondary | ICD-10-CM

## 2015-03-29 LAB — POCT URINALYSIS DIPSTICK
Bilirubin, UA: 1
Blood, UA: NEGATIVE
Glucose, UA: NEGATIVE
KETONES UA: NEGATIVE
LEUKOCYTES UA: NEGATIVE
NITRITE UA: NEGATIVE
PH UA: 7
Spec Grav, UA: 1.015
UROBILINOGEN UA: 0.2

## 2015-03-29 MED ORDER — TETANUS-DIPHTH-ACELL PERTUSSIS 5-2.5-18.5 LF-MCG/0.5 IM SUSP
0.5000 mL | Freq: Once | INTRAMUSCULAR | Status: AC
  Administered 2015-03-29: 0.5 mL via INTRAMUSCULAR

## 2015-03-29 NOTE — Patient Instructions (Signed)
Tdap Vaccine (Tetanus, Diphtheria and Pertussis): What You Need to Know 1. Why get vaccinated? Tetanus, diphtheria and pertussis are very serious diseases. Tdap vaccine can protect us from these diseases. And, Tdap vaccine given to pregnant women can protect newborn babies against pertussis. TETANUS (Lockjaw) is rare in the [place] today. It causes painful muscle tightening and stiffness, usually all over the body.  It can lead to tightening of muscles in the head and neck so you can't open your mouth, swallow, or sometimes even breathe. Tetanus kills about 1 out of 10 people who are infected even after receiving the best medical care. DIPHTHERIA is also rare in the [place] today. It can cause a thick coating to form in the back of the throat.  It can lead to breathing problems, heart failure, paralysis, and death. PERTUSSIS (Whooping Cough) causes severe coughing spells, which can cause difficulty breathing, vomiting and disturbed sleep.  It can also lead to weight loss, incontinence, and rib fractures. Up to 2 in 100 adolescents and 5 in 100 adults with pertussis are hospitalized or have complications, which could include pneumonia or death. These diseases are caused by bacteria. Diphtheria and pertussis are spread from person to person through secretions from coughing or sneezing. Tetanus enters the body through cuts, scratches, or wounds. Before vaccines, as many as 200,000 cases of diphtheria, 200,000 cases of pertussis, and hundreds of cases of tetanus, were reported in the [place] each year. Since vaccination began, reports of cases for tetanus and diphtheria have dropped by about 99% and for pertussis by about 80%. 2. Tdap vaccine Tdap vaccine can protect adolescents and adults from tetanus, diphtheria, and pertussis. One dose of Tdap is routinely given at age 11 or 12. People who did not get Tdap at that age should get it as soon as possible. Tdap is especially important  for healthcare professionals and anyone having close contact with a baby younger than 12 months. Pregnant women should get a dose of Tdap during every pregnancy, to protect the newborn from pertussis. Infants are most at risk for severe, life-threatening complications from pertussis. Another vaccine, called Td, protects against tetanus and diphtheria, but not pertussis. A Td booster should be given every 10 years. Tdap may be given as one of these boosters if you have never gotten Tdap before. Tdap may also be given after a severe cut or burn to prevent tetanus infection. Your doctor or the person giving you the vaccine can give you more information. Tdap may safely be given at the same time as other vaccines. 3. Some people should not get this vaccine  A person who has ever had a life-threatening allergic reaction after a previous dose of any diphtheria, tetanus or pertussis containing vaccine, OR has a severe allergy to any part of this vaccine, should not get Tdap vaccine. Tell the person giving the vaccine about any severe allergies.  Anyone who had coma or long repeated seizures within 7 days after a childhood dose of DTP or DTaP, or a previous dose of Tdap, should not get Tdap, unless a cause other than the vaccine was found. They can still get Td.  Talk to your doctor if you:  have seizures or another nervous system problem,  had severe pain or swelling after any vaccine containing diphtheria, tetanus or pertussis,  ever had a condition called Guillain-Barr Syndrome (GBS),  aren't feeling well on the day the shot is scheduled. 4. Risks With any medicine, including vaccines, there is   a chance of side effects. These are usually mild and go away on their own. Serious reactions are also possible but are rare. Most people who get Tdap vaccine do not have any problems with it. Mild problems following Tdap (Did not interfere with activities)  Pain where the shot was given (about 3 in 4  adolescents or 2 in 3 adults)  Redness or swelling where the shot was given (about 1 person in 5)  Mild fever of at least 100.4F (up to about 1 in 25 adolescents or 1 in 100 adults)  Headache (about 3 or 4 people in 10)  Tiredness (about 1 person in 3 or 4)  Nausea, vomiting, diarrhea, stomach ache (up to 1 in 4 adolescents or 1 in 10 adults)  Chills, sore joints (about 1 person in 10)  Body aches (about 1 person in 3 or 4)  Rash, swollen glands (uncommon) Moderate problems following Tdap (Interfered with activities, but did not require medical attention)  Pain where the shot was given (up to 1 in 5 or 6)  Redness or swelling where the shot was given (up to about 1 in 16 adolescents or 1 in 12 adults)  Fever over 102F (about 1 in 100 adolescents or 1 in 250 adults)  Headache (about 1 in 7 adolescents or 1 in 10 adults)  Nausea, vomiting, diarrhea, stomach ache (up to 1 or 3 people in 100)  Swelling of the entire arm where the shot was given (up to about 1 in 500). Severe problems following Tdap (Unable to perform usual activities; required medical attention)  Swelling, severe pain, bleeding and redness in the arm where the shot was given (rare). Problems that could happen after any vaccine:  People sometimes faint after a medical procedure, including vaccination. Sitting or lying down for about 15 minutes can help prevent fainting, and injuries caused by a fall. Tell your doctor if you feel dizzy, or have vision changes or ringing in the ears.  Some people get severe pain in the shoulder and have difficulty moving the arm where a shot was given. This happens very rarely.  Any medication can cause a severe allergic reaction. Such reactions from a vaccine are very rare, estimated at fewer than 1 in a million doses, and would happen within a few minutes to a few hours after the vaccination. As with any medicine, there is a very remote chance of a vaccine causing a serious  injury or death. The safety of vaccines is always being monitored. For more information, visit: www.cdc.gov/vaccinesafety/ 5. What if there is a serious problem? What should I look for?  Look for anything that concerns you, such as signs of a severe allergic reaction, very high fever, or unusual behavior.  Signs of a severe allergic reaction can include hives, swelling of the face and throat, difficulty breathing, a fast heartbeat, dizziness, and weakness. These would usually start a few minutes to a few hours after the vaccination. What should I do?  If you think it is a severe allergic reaction or other emergency that can't wait, call 9-1-1 or get the person to the nearest hospital. Otherwise, call your doctor.  Afterward, the reaction should be reported to the Vaccine Adverse Event Reporting System (VAERS). Your doctor might file this report, or you can do it yourself through the VAERS web site at www.vaers.hhs.gov, or by calling 1-800-822-7967. VAERS does not give medical advice.  6. The National Vaccine Injury Compensation Program The National Vaccine Injury Compensation Program (  VICP) is a federal program that was created to compensate people who may have been injured by certain vaccines. Persons who believe they may have been injured by a vaccine can learn about the program and about filing a claim by calling 1-800-338-2382 or visiting the VICP website at www.hrsa.gov/vaccinecompensation. There is a time limit to file a claim for compensation. 7. How can I learn more?  Ask your doctor. He or she can give you the vaccine package insert or suggest other sources of information.  Call your local or state health department.  Contact the Centers for Disease Control and Prevention (CDC):  Call 1-800-232-4636 (1-800-CDC-INFO) or  Visit CDC's website at www.cdc.gov/vaccines CDC Tdap Vaccine VIS (06/07/13)   This information is not intended to replace advice given to you by your health care  provider. Make sure you discuss any questions you have with your health care provider.   Document Released: 09/30/2011 Document Revised: 04/21/2014 Document Reviewed: 07/13/2013 Elsevier Interactive Patient Education 2016 [employer]  

## 2015-03-29 NOTE — Progress Notes (Signed)
Tdap and BTC signed. See bs log. C/o of vaginal discharge- slight odor. Slight itching. D/c is white. Start weekly NSTs for GDM; NST is reactive.Review of blood sugar log is notable for persistently elevated 2 hour postprandials as well as occasional elevated fastings.  She is not being consistent in taking her glyburide 5 mg twice a day, especially in the mornings.  I have advised her to be very diligent with her medication usage over the next week.  If her blood sugar log shows consistent elevations, she will be referred to [facility] for initiation of insulin therapy for this pregnancy.  She does understand the significant implications of uncontrolled gestational diabetes mellitus on both she and her infant.  Infant is breech today.

## 2015-04-03 ENCOUNTER — Telehealth: Payer: Self-pay | Admitting: Obstetrics and Gynecology

## 2015-04-03 NOTE — Telephone Encounter (Signed)
Pt called and she is [redacted] weeks pregnant she thinks maybe getting a yeast infection, itching and burning symptoms also having a rash of some sort in genital area not sure if she needs to come in before her appt or not , pt would like a call back.

## 2015-04-03 NOTE — Telephone Encounter (Signed)
Pt c/o of itching and burning. White d/c. No change if detergent or bodywash. Advised monistat otc. If sx gets worse can make an appt to be seen sooner. Pt c/o of this at last visit- but declined an exam.

## 2015-04-05 ENCOUNTER — Ambulatory Visit (INDEPENDENT_AMBULATORY_CARE_PROVIDER_SITE_OTHER): Payer: Medicaid Other | Admitting: Obstetrics and Gynecology

## 2015-04-05 ENCOUNTER — Encounter: Payer: Self-pay | Admitting: Obstetrics and Gynecology

## 2015-04-05 VITALS — BP 91/55 | HR 103 | Wt 181.2 lb

## 2015-04-05 DIAGNOSIS — O24419 Gestational diabetes mellitus in pregnancy, unspecified control: Secondary | ICD-10-CM

## 2015-04-05 DIAGNOSIS — N9089 Other specified noninflammatory disorders of vulva and perineum: Secondary | ICD-10-CM

## 2015-04-05 DIAGNOSIS — B379 Candidiasis, unspecified: Secondary | ICD-10-CM

## 2015-04-05 DIAGNOSIS — Z3493 Encounter for supervision of normal pregnancy, unspecified, third trimester: Secondary | ICD-10-CM

## 2015-04-05 LAB — POCT URINALYSIS DIPSTICK
BILIRUBIN UA: 1
GLUCOSE UA: NEGATIVE
KETONES UA: NEGATIVE
LEUKOCYTES UA: NEGATIVE
NITRITE UA: NEGATIVE
PH UA: 6
Protein, UA: 1
RBC UA: NEGATIVE
Spec Grav, UA: 1.025
Urobilinogen, UA: 0.2

## 2015-04-05 MED ORDER — FLUCONAZOLE 150 MG PO TABS: 150.0000 mg | ORAL_TABLET | Freq: Every day | ORAL | Status: DC

## 2015-04-05 MED ORDER — VALACYCLOVIR HCL 1 G PO TABS: 1000.0000 mg | ORAL_TABLET | Freq: Every day | ORAL | Status: DC

## 2015-04-05 NOTE — Progress Notes (Signed)
Vaginal itching. See BS log- Blood sugar, fasting 67-117; 2 hour postprandial blood sugar 113-190, 13/14 above 120.  Unacceptable blood sugar control on glyburide 5 mg twice a day.  Patient is referred to [facility] for initiation of insulin therapy for suboptimal diabetes mellitus controlled. NST is reactive. 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  Patient will be also treated with Diflucan 150 mg by mouth for probable yeast.

## 2015-04-06 LAB — HSV(HERPES SIMPLEX VRS) I + II AB-IGG: HSV 1 Glycoprotein G Ab, IgG: <0.91 index (ref 0.00–0.90)

## 2015-04-12 ENCOUNTER — Encounter: Payer: Medicaid Other | Admitting: Obstetrics and Gynecology

## 2015-04-12 ENCOUNTER — Ambulatory Visit (INDEPENDENT_AMBULATORY_CARE_PROVIDER_SITE_OTHER): Payer: Medicaid Other | Admitting: Obstetrics and Gynecology

## 2015-04-12 VITALS — BP 98/63 | HR 81 | Wt 181.8 lb

## 2015-04-12 DIAGNOSIS — Z9119 Patient's noncompliance with other medical treatment and regimen: Secondary | ICD-10-CM

## 2015-04-12 DIAGNOSIS — O34219 Maternal care for unspecified type scar from previous cesarean delivery: Secondary | ICD-10-CM

## 2015-04-12 DIAGNOSIS — O24419 Gestational diabetes mellitus in pregnancy, unspecified control: Secondary | ICD-10-CM

## 2015-04-12 DIAGNOSIS — E669 Obesity, unspecified: Secondary | ICD-10-CM

## 2015-04-12 DIAGNOSIS — Z91199 Patient's noncompliance with other medical treatment and regimen due to unspecified reason: Secondary | ICD-10-CM | POA: Insufficient documentation

## 2015-04-12 DIAGNOSIS — O99213 Obesity complicating pregnancy, third trimester: Secondary | ICD-10-CM

## 2015-04-12 DIAGNOSIS — O09523 Supervision of elderly multigravida, third trimester: Secondary | ICD-10-CM

## 2015-04-12 DIAGNOSIS — Z3493 Encounter for supervision of normal pregnancy, unspecified, third trimester: Secondary | ICD-10-CM

## 2015-04-12 LAB — POCT URINALYSIS DIPSTICK
BILIRUBIN UA: NEGATIVE
Blood, UA: NEGATIVE
Glucose, UA: NEGATIVE
KETONES UA: NEGATIVE
Leukocytes, UA: NEGATIVE
Nitrite, UA: NEGATIVE
PH UA: 6.5
SPEC GRAV UA: 1.025
Urobilinogen, UA: NEGATIVE

## 2015-04-12 NOTE — Progress Notes (Signed)
ROB:  Patient notes that discharge has improved since last visit after tx for yeast vaginitis. Still reporting difficulty with compliance with diet and medications for GDM. Fastings ranging 70s-110s, and 2 hr postprandials100s-180s.   Has appointment with [facility] next week.  Size> dates today and on last visit, will schedule for growth scan tomorrow.  Small area of pitting edema noted at base of pannus today.  Patient has now decided on repeat C-section. Tentatively will schedule for 39 week if BS better controlled, otherwise will await for recommendations from [facility] if delivery should need to be sooner. NST today reactive, continue twice weekly NSTs.  HSV cultures from last visit negative.   NST INTERPRETATION:  Indications: gestational diabetes mellitus OBJECTIVE RESULTS: Fetal heart variability: moderate Fetal Heart Rate decelerations: none Fetal Heart Rate accelerations: yes Baseline FHR: 130 per minute Uterine irritability: yes Uterine contractions: none  Fetal surveillance: reassuring   Impression: reactive   Plan:  1. Fetal kick counts daily 2. Continue twice weekly NSTs    Hildred LaserAnika Rhyse Skowron, MD

## 2015-04-13 ENCOUNTER — Ambulatory Visit (INDEPENDENT_AMBULATORY_CARE_PROVIDER_SITE_OTHER): Payer: Medicaid Other

## 2015-04-13 DIAGNOSIS — O24419 Gestational diabetes mellitus in pregnancy, unspecified control: Secondary | ICD-10-CM

## 2015-04-15 NOTE — L&D Delivery Note (Signed)
Delivery Summary for Stacy Best  Labor Events:   Preterm labor:   Rupture date:   Rupture time:   Rupture type: Intact  Fluid Color:   Induction:   Augmentation:   Complications:   Cervical ripening:          Delivery:   Episiotomy:   Lacerations:   Repair suture:   Repair # of packets:   Blood loss (ml):    Information for the patient's newborn:  [name], [name] [[id number]]    Delivery 05/10/2015 9:53 AM by  C-Section, Low Transverse Sex:  female Gestational Age: [redacted]w[redacted]d Delivery Clinician:  Hildred Laser Living?: Yes        APGARS  One minute Five minutes Ten minutes  Skin color: 0   1      Heart rate: 2   2      Grimace: 2   2      Muscle tone: 2   2      Breathing: 2   2      Totals: 8  9      Presentation/position: Vertex     Resuscitation: None  Cord information: 3 vessels   Disposition of cord blood: No    Blood gases sent? No Complications: None  Placenta: Delivered: 05/10/2015 9:54 AM  Manual removal  Intact appearance Newborn Measurements: Weight: 10 lb (4536 g)  Height: 20.5"  Head circumference: 37 cm  Chest circumference: 37 cm  Other providers: Registered Nurse Transition RN Neonatologist Hudson H Gaither Amy F Daniels Mccrae S Smith  Additional  information: Forceps:   Vacuum:   Breech:   Observed anomalies        See Cesarean Operative note by Dr. Hildred Laser for details of surgical procedure.   Hildred Laser, MD [facility]

## 2015-04-17 ENCOUNTER — Ambulatory Visit (INDEPENDENT_AMBULATORY_CARE_PROVIDER_SITE_OTHER): Payer: Medicaid Other | Admitting: Obstetrics and Gynecology

## 2015-04-17 ENCOUNTER — Observation Stay
Admission: EM | Admit: 2015-04-17 | Discharge: 2015-04-18 | Disposition: A | Discharge status: Home / Self Care | Payer: Medicaid Other | Attending: Obstetrics and Gynecology | Admitting: Obstetrics and Gynecology

## 2015-04-17 ENCOUNTER — Encounter: Payer: Self-pay | Admitting: *Deleted

## 2015-04-17 DIAGNOSIS — O4703 False labor before 37 completed weeks of gestation, third trimester: Secondary | ICD-10-CM | POA: Diagnosis not present

## 2015-04-17 DIAGNOSIS — Z3A34 34 weeks gestation of pregnancy: Secondary | ICD-10-CM | POA: Diagnosis not present

## 2015-04-17 DIAGNOSIS — O09523 Supervision of elderly multigravida, third trimester: Secondary | ICD-10-CM | POA: Diagnosis not present

## 2015-04-17 DIAGNOSIS — O24419 Gestational diabetes mellitus in pregnancy, unspecified control: Secondary | ICD-10-CM

## 2015-04-17 DIAGNOSIS — Z369 Encounter for antenatal screening, unspecified: Secondary | ICD-10-CM

## 2015-04-17 DIAGNOSIS — O99210 Obesity complicating pregnancy, unspecified trimester: Secondary | ICD-10-CM | POA: Diagnosis not present

## 2015-04-17 DIAGNOSIS — O34219 Maternal care for unspecified type scar from previous cesarean delivery: Secondary | ICD-10-CM

## 2015-04-17 DIAGNOSIS — Z113 Encounter for screening for infections with a predominantly sexual mode of transmission: Secondary | ICD-10-CM

## 2015-04-17 MED ORDER — HYDROCODONE-ACETAMINOPHEN 5-325 MG PO TABS: 1.0000 | ORAL_TABLET | ORAL | Status: DC | PRN

## 2015-04-17 MED ORDER — BETAMETHASONE SOD PHOS & ACET 6 (3-3) MG/ML IJ SUSP
12.0000 mg | Freq: Once | INTRAMUSCULAR | Status: AC
  Administered 2015-04-17: 12 mg via INTRAMUSCULAR
  Filled 2015-04-17: qty 2

## 2015-04-17 MED ORDER — LACTATED RINGERS IV BOLUS (SEPSIS)
1000.0000 mL | Freq: Once | INTRAVENOUS | Status: AC
  Administered 2015-04-17: 1000 mL via INTRAVENOUS

## 2015-04-17 MED ORDER — TERBUTALINE SULFATE 1 MG/ML IJ SOLN
0.2500 mg | INTRAMUSCULAR | Status: DC | PRN
  Administered 2015-04-17 – 2015-04-18 (×2): 0.25 mg via SUBCUTANEOUS
  Filled 2015-04-17: qty 1

## 2015-04-17 NOTE — Final Progress Note (Signed)
L&D OB Triage Note  HPI:  Vic BlackbirdLorena Meza Lara is a 35 y.o. [id number] female at 6774w2d, Estimated Date of Delivery: 05/27/15 who presented for rule out preterm labor.  Patient was seen in clinic earlier today for NST for GDM (on Glyburide), and was noted to have contractions q 2-3 minutes that were mild to moderate in intensity.  Cervix at that time was 2/50/-3/(unknown presenting part). Of note, patient has h/o prior C-section x 1, desiring repeat C-section.     Obstetric History   G4   P1   T1   P0   A2   TAB1   SAB1   E0   M0   L1     # Outcome Date GA Lbr Len/2nd Weight Sex Delivery Anes PTL Lv  4 Current           3 Term 2015 5435w0d  9 lb (4.082 kg) F CS-Unspec EPI  Y     Name: [name]     Complications: Chorioamnionitis,Tachycardia  2 TAB 2003          1 SAB 2000            Obstetric Comments  Patient dilated to 9 cm prior to C-section for NRFHT (persistent tachycardia due to chorioamnionitis); prolonged labor    I have reviewed the patient's past medical, social, family, surgical, medication and allergy history in detail and updated the computerized patient record.   ROS:  Review of Systems - Negative except contractions beginning at approximately 2 pm today, q 3-5 min   Physical Exam:  Blood pressure 86/39, pulse 91, temperature 98.6 F (37 C), temperature source Oral, resp. rate 17, height 5' (1.524 m), weight 181 lb (82.101 kg), last menstrual period 08/20/2014. General appearance: alert and no distress Abdomen: soft, non-tender; bowel sounds normal; no masses,  no organomegaly, gravid Pelvic: external genitalia normal  3/50/-3/unable to determine presenting part. Extremities: extremities normal, atraumatic, no cyanosis or edema     Labs:  Results for orders placed or performed during the hospital encounter of 04/17/15  Urinalysis complete, with microscopic ([facility] only)  Result Value Ref Range   Color, Urine YELLOW (A) YELLOW   APPearance CLOUDY (A) CLEAR   Glucose, UA 50 (A)  NEGATIVE mg/dL   Bilirubin Urine NEGATIVE NEGATIVE   Ketones, ur 2+ (A) NEGATIVE mg/dL   Specific Gravity, Urine 1.018 1.005 - 1.030   Hgb urine dipstick NEGATIVE NEGATIVE   pH 5.0 5.0 - 8.0   Protein, ur 30 (A) NEGATIVE mg/dL   Nitrite NEGATIVE NEGATIVE   Leukocytes, UA NEGATIVE NEGATIVE   RBC / HPF 0-5 0 - 5 RBC/hpf   WBC, UA 0-5 0 - 5 WBC/hpf   Bacteria, UA MANY (A) NONE SEEN   Squamous Epithelial / LPF 0-5 (A) NONE SEEN   Mucous PRESENT     Assessment:  35 y.o. [id number] at 7274w2d with:  1. Preterm labor, likely secondary to dehydration (2+ ketones on UA) 2. H/o prior C-section x 1, desiring repeat c-section 3. GDM (on glyburide)   Plan:  1. Given 1 liter LR bolus, 2 doses of terbutaline, with arrest of labor 2. Initiated betamethasone course (1st dose given), will need to return for second dose in 24 hours.  3. Previously obtained genital cultures in clinic.  4. Can d/c home with labor precautions.  5. Scheduled for f/u with [facility] for management of poorly controlled DM on Glyburide.  For scheduled NST in office in 2 days.  Hildred Laser, MD

## 2015-04-18 ENCOUNTER — Inpatient Hospital Stay
Admission: RE | Admit: 2015-04-18 | Discharge: 2015-04-18 | Disposition: A | Discharge status: Home / Self Care | Payer: Medicaid Other | Attending: Obstetrics and Gynecology | Admitting: Obstetrics and Gynecology

## 2015-04-18 DIAGNOSIS — Z3A36 36 weeks gestation of pregnancy: Secondary | ICD-10-CM | POA: Insufficient documentation

## 2015-04-18 DIAGNOSIS — Z3493 Encounter for supervision of normal pregnancy, unspecified, third trimester: Secondary | ICD-10-CM | POA: Insufficient documentation

## 2015-04-18 LAB — URINALYSIS COMPLETE WITH MICROSCOPIC (ARMC ONLY)
Bilirubin Urine: NEGATIVE
Glucose, UA: 50 mg/dL — AB
Hgb urine dipstick: NEGATIVE
Leukocytes, UA: NEGATIVE
Nitrite: NEGATIVE
PH: 5 (ref 5.0–8.0)
PROTEIN: 30 mg/dL — AB
Specific Gravity, Urine: 1.018 (ref 1.005–1.030)

## 2015-04-18 MED ORDER — BETAMETHASONE SOD PHOS & ACET 6 (3-3) MG/ML IJ SUSP
12.0000 mg | Freq: Once | INTRAMUSCULAR | Status: AC
  Administered 2015-04-18: 12 mg via INTRAMUSCULAR

## 2015-04-18 NOTE — Progress Notes (Signed)
Problem OB: Patient initially presented for NST only, was noted to be having moderately painful contractions (q 2-3 min on toco). Unable to get adequate fetal monitoring due to patient discomfort and active fetal movement.  Cervix 2/50/c/ballotable/posterior. 3rd trimester labs performed. Sent to L&D for further evaluation. If undelivered, continue bi-weekly NSTs, and [facility] for further management of GDM.   NST INTERPRETATION:  Indications: gestational diabetes mellitus  OBJECTIVE RESULTS:  Fetal heart variability: moderate  Fetal Heart Rate decelerations: none  Fetal Heart Rate accelerations: yes  Baseline FHR: 135 per minute  Uterine irritability: no Uterine contractions: yes Fetal surveillance: undeterminable (fetal tracing with several intermittent breaks between tracing, lasting at least 3-4 minutes)   Impression: undeterminable Plan:  1. Send to Labor and Delivery for further monitoring and to r/o labor 2. Fetal kick counts daily  3. Continue twice weekly NSTs

## 2015-04-18 NOTE — Discharge Summary (Signed)
Discharge instructions reviewed with patient including follow up appointments, signs of preterm labor, and when to seek medical attention. Patient instructed on need [facility] tonight at 2100. Patient discharged home in stable condition, ambulatory with steady gait, no signs of distress noted.

## 2015-04-19 ENCOUNTER — Ambulatory Visit
Admission: RE | Admit: 2015-04-19 | Discharge: 2015-04-19 | Disposition: A | Discharge status: Home / Self Care | Payer: Medicaid Other | Source: Ambulatory Visit | Attending: Maternal & Fetal Medicine | Admitting: Maternal & Fetal Medicine

## 2015-04-19 VITALS — BP 107/60 | HR 92 | Temp 97.8°F | Resp 18 | Wt 182.0 lb

## 2015-04-19 DIAGNOSIS — O24415 Gestational diabetes mellitus in pregnancy, controlled by oral hypoglycemic drugs: Secondary | ICD-10-CM | POA: Diagnosis not present

## 2015-04-19 DIAGNOSIS — Z3A34 34 weeks gestation of pregnancy: Secondary | ICD-10-CM

## 2015-04-19 DIAGNOSIS — O24419 Gestational diabetes mellitus in pregnancy, unspecified control: Secondary | ICD-10-CM | POA: Diagnosis present

## 2015-04-19 LAB — GLUCOSE, CAPILLARY: GLUCOSE-CAPILLARY: 152 mg/dL — AB (ref 65–99)

## 2015-04-19 LAB — GC/CHLAMYDIA PROBE AMP
Chlamydia trachomatis, NAA: NEGATIVE
Neisseria gonorrhoeae by PCR: NEGATIVE

## 2015-04-19 NOTE — Progress Notes (Signed)
[facility] Consultation   Chief Complaint: Gestational diabetes  HPI: Stacy Best is a 35 y.o. [id number] at 3600w4d by LMP and  1190w6d US who presents in consultation from  [facility] regarding recommendations for management of gestational diabetes now requiring treatment with a hypoglycemic agent.  She is currently taking glyburide 5 mg bid.  She has seen on L & D 04/17/2015 for preterm contractions at which time she received steroids and received her second dose yesterday.    Obstetric History:  Obstetric History   G4   P1   T1   P0   A2   TAB1   SAB1   E0   M0   L1     # Outcome Date GA Lbr Len/2nd Weight Sex Delivery Anes PTL Lv  4 Current           3 Term 2015 1162w0d  9 lb (4.082 kg) F CS-Unspec EPI  Y     Name: [name]     Complications: Chorioamnionitis,Tachycardia  2 TAB 2003          1 SAB 2000            Obstetric Comments  Patient dilated to 9 cm prior to C-section for NRFHT (persistent tachycardia due to chorioamnionitis); prolonged labor    Gynecologic History:  Patient's last menstrual period was 08/20/2014 (approximate).    Past Medical History: Patient  has a past medical history of Medical history non-contributory; Need for Tdap vaccination; Gestational diabetes; and Genital HSV.   Past Surgical History: She  has past surgical history that includes Cesarean section (2015) and Ankle fracture surgery.   Medications:  Current Outpatient Prescriptions on File Prior to Encounter  Medication Sig Dispense Refill  . ACCU-CHEK FASTCLIX LANCETS MISC 1 Device by Percutaneous route 4 (four) times daily. 100 each 12  . glucose blood test strip Use as instructed 100 each 12  . glyBURIDE (DIABETA) 5 MG tablet Take 1 tablet (5 mg total) by mouth 2 (two) times daily with a meal. 60 tablet 3  . Prenatal MV-Min-Fe Fum-FA-DHA (PRENATAL MULTIVITAMIN PLUS DHA) 27-0.8-250 MG CAPS Take 1 tablet by mouth daily. 60 capsule 4  . fluconazole (DIFLUCAN) 150 MG tablet  Take 1 tablet (150 mg total) by mouth daily. (Patient not taking: Reported on 04/17/2015) 1 tablet 0  . valACYclovir (VALTREX) 1000 MG tablet Take 1 tablet (1,000 mg total) by mouth daily. (Patient not taking: Reported on 04/17/2015) 30 tablet 1   No current facility-administered medications on file prior to encounter.    Allergies: Patient has No Known Allergies.   Social History: Patient  reports that she has never smoked. She has never used smokeless tobacco. She reports that she does not drink alcohol or use illicit drugs.   Family History: family history includes Breast cancer (age of onset: 36) in her mother; Diabetes in her father. There is no history of Ovarian cancer, Colon cancer, or Heart disease.   Review of Systems A full 12 point review of systems was negative or as noted in the History of Present Illness. Additionally, the patient reports occasional contractions, no leaking of water or bleeding and positive fetal movement.    BS Review:   FBSs 86-135 with 5/11 > 95 2 hr PP breakfast 93-157 with 6/8 > 120 2 hr PP lunch 60-160 with 3/6 > 120 (sometimes misses meal at lunchtime) 2 hr PP dinner 127-185 with 4/4 > 120    Physical Exam: BP 107/60  mmHg  Pulse 92  Temp(Src) 97.8 F (36.6 C) (Oral)  Resp 18  Wt 182 lb (82.555 kg)  LMP 08/20/2014 (Approximate) Body mass index is 35.54 kg/(m^2).   RBS today - 152  [place] at [facility] on 04/03/2015 at [redacted]w[redacted]d gestation - VTX, EFW 2686 g (which would be the 90th%tile for [redacted]w[redacted]d), AFI of 14.6 cm  Asessement: 35 yo gravida  G4P1021 at [redacted]w[redacted]d with: 1. Gestational diabetes with inadequately controlled blood sugars on current regimen even before she received steroids; now with fetal macrosomia 2.  Other risk factors of AMA and obesity 3. 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 -- She reports that she is already undergoing twice week.  I would recommend weekly AFIs -- The patient was scheduled to return here in 1 week for blood sugar review, [place] for EFW, AC and AFI, and for recommendations regarding timing at delivery.  If the EFW is > 90%tile, we will likely recommend delivery before 39 weeks (ie at 37 weeks).   -- The patient was counseled about the usual warnings and kick counts. -- Given the suspected fetal macrosomia and history of previous cesarean with fetal macrosomia, I would recommend delivery by repeat cesarean.  We did not discuss the possibility of BTL.   Total time spent with the patient was 40 minutes with greater than 50% spent in counseling and coordination of care.  We appreciate this consult and will be happy to be involved in the ongoing care of [name] in anyway her obstetricians desire.  Argentina Ponder, MD [facility]

## 2015-04-20 ENCOUNTER — Inpatient Hospital Stay
Admission: AD | Admit: 2015-04-20 | Discharge: 2015-04-20 | Disposition: A | Discharge status: Home / Self Care | Payer: Medicaid Other | Attending: Obstetrics and Gynecology | Admitting: Obstetrics and Gynecology

## 2015-04-20 ENCOUNTER — Ambulatory Visit (INDEPENDENT_AMBULATORY_CARE_PROVIDER_SITE_OTHER): Payer: Medicaid Other | Admitting: Obstetrics and Gynecology

## 2015-04-20 ENCOUNTER — Encounter: Payer: Medicaid Other | Admitting: Obstetrics and Gynecology

## 2015-04-20 VITALS — BP 99/63 | HR 81 | Wt 185.9 lb

## 2015-04-20 DIAGNOSIS — O34219 Maternal care for unspecified type scar from previous cesarean delivery: Secondary | ICD-10-CM

## 2015-04-20 DIAGNOSIS — O24419 Gestational diabetes mellitus in pregnancy, unspecified control: Secondary | ICD-10-CM

## 2015-04-20 DIAGNOSIS — Z36 Encounter for antenatal screening of mother: Secondary | ICD-10-CM | POA: Insufficient documentation

## 2015-04-20 DIAGNOSIS — Z3A36 36 weeks gestation of pregnancy: Secondary | ICD-10-CM | POA: Diagnosis not present

## 2015-04-20 DIAGNOSIS — Z3483 Encounter for supervision of other normal pregnancy, third trimester: Secondary | ICD-10-CM

## 2015-04-20 DIAGNOSIS — O09523 Supervision of elderly multigravida, third trimester: Secondary | ICD-10-CM

## 2015-04-20 DIAGNOSIS — Z3493 Encounter for supervision of normal pregnancy, unspecified, third trimester: Secondary | ICD-10-CM

## 2015-04-20 LAB — POCT URINALYSIS DIPSTICK
Bilirubin, UA: NEGATIVE
Blood, UA: NEGATIVE
Glucose, UA: NEGATIVE
Ketones, UA: NEGATIVE
Leukocytes, UA: NEGATIVE
NITRITE UA: NEGATIVE
PH UA: 6
Spec Grav, UA: 1.015
UROBILINOGEN UA: NEGATIVE

## 2015-04-20 MED ORDER — ACETAMINOPHEN 325 MG PO TABS
650.0000 mg | ORAL_TABLET | Freq: Four times a day (QID) | ORAL | Status: DC | PRN
  Administered 2015-04-20: 650 mg via ORAL
  Filled 2015-04-20: qty 2

## 2015-04-20 NOTE — Progress Notes (Signed)
ROB: Patient following up s/p L&D triage visit 2 days ago with arrested preterm labor.  Has received antenatal steroids. Denies any further contractions.  [facility].  Concern for fetal macrosomia based on recent growth scan from 04/13/15. They will repeat scan next week, recommend increase of Glyburide to 10 mg BID, and weekly AFI's in addition to current antenatal testing.  Patient likely to need earlier delivery (to be determined by new findings next week), with repeat C-section as mode of delivery recommended. Attempted NST after visit today, however unable to keep fetus on monitor due to body habitus and movement.  Will send to L&D for completion of NST. RTC in 2 weeks.

## 2015-04-20 NOTE — Discharge Instructions (Signed)
Fetal Movement Counts Patient Name: __[name]________________________________________________ Patient Due Date: ____________________ Performing a fetal movement count is highly recommended in high-risk pregnancies, but it is good for every pregnant woman to do. Your health care provider may ask you to start counting fetal movements at 28 weeks of the pregnancy. Fetal movements often increase:  After eating a full meal.  After physical activity.  After eating or drinking something sweet or cold.  At rest. Pay attention to when you feel the baby is most active. This will help you notice a pattern of your baby's sleep and wake cycles and what factors contribute to an increase in fetal movement. It is important to perform a fetal movement count at the same time each day when your baby is normally most active.  HOW TO COUNT FETAL MOVEMENTS 1. Find a quiet and comfortable area to sit or lie down on your left side. Lying on your left side provides the best blood and oxygen circulation to your baby. 2. Write down the day and time on a sheet of paper or in a journal. 3. Start counting kicks, flutters, swishes, rolls, or jabs in a 2-hour period. You should feel at least 10 movements within 2 hours. 4. If you do not feel 10 movements in 2 hours, wait 2-3 hours and count again. Look for a change in the pattern or not enough counts in 2 hours. SEEK MEDICAL CARE IF:  You feel less than 10 counts in 2 hours, tried twice.  There is no movement in over an hour.  The pattern is changing or taking longer each day to reach 10 counts in 2 hours.  You feel the baby is not moving as he or she usually does. Date: ____________ Movements: ____________ Start time: ____________ Doreatha Martin time: ____________  Date: ____________ Movements: ____________ Start time: ____________ Doreatha Martin time: ____________ Date: ____________ Movements: ____________ Start time: ____________ Doreatha Martin time: ____________ Date:  ____________ Movements: ____________ Start time: ____________ Doreatha Martin time: ____________ Date: ____________ Movements: ____________ Start time: ____________ Doreatha Martin time: ____________ Date: ____________ Movements: ____________ Start time: ____________ Doreatha Martin time: ____________ Date: ____________ Movements: ____________ Start time: ____________ Doreatha Martin time: ____________ Date: ____________ Movements: ____________ Start time: ____________ Doreatha Martin time: ____________  Date: ____________ Movements: ____________ Start time: ____________ Doreatha Martin time: ____________ Date: ____________ Movements: ____________ Start time: ____________ Doreatha Martin time: ____________ Date: ____________ Movements: ____________ Start time: ____________ Doreatha Martin time: ____________ Date: ____________ Movements: ____________ Start time: ____________ Doreatha Martin time: ____________ Date: ____________ Movements: ____________ Start time: ____________ Doreatha Martin time: ____________ Date: ____________ Movements: ____________ Start time: ____________ Doreatha Martin time: ____________ Date: ____________ Movements: ____________ Start time: ____________ Doreatha Martin time: ____________  Date: ____________ Movements: ____________ Start time: ____________ Doreatha Martin time: ____________ Date: ____________ Movements: ____________ Start time: ____________ Doreatha Martin time: ____________ Date: ____________ Movements: ____________ Start time: ____________ Doreatha Martin time: ____________ Date: ____________ Movements: ____________ Start time: ____________ Doreatha Martin time: ____________ Date: ____________ Movements: ____________ Start time: ____________ Doreatha Martin time: ____________ Date: ____________ Movements: ____________ Start time: ____________ Doreatha Martin time: ____________ Date: ____________ Movements: ____________ Start time: ____________ Doreatha Martin time: ____________  Date: ____________ Movements: ____________ Start time: ____________ Doreatha Martin time: ____________ Date: ____________ Movements: ____________ Start  time: ____________ Doreatha Martin time: ____________ Date: ____________ Movements: ____________ Start time: ____________ Doreatha Martin time: ____________ Date: ____________ Movements: ____________ Start time: ____________ Doreatha Martin time: ____________ Date: ____________ Movements: ____________ Start time: ____________ Doreatha Martin time: ____________ Date: ____________ Movements: ____________ Start time: ____________ Doreatha Martin time: ____________ Date: ____________ Movements: ____________ Start time: ____________ Doreatha Martin time: ____________  Date: ____________ Movements: ____________ Start time:  ____________ Doreatha MartinFinish time: ____________ Date: ____________ Movements: ____________ Start time: ____________ Doreatha MartinFinish time: ____________ Date: ____________ Movements: ____________ Start time: ____________ Doreatha MartinFinish time: ____________ Date: ____________ Movements: ____________ Start time: ____________ Doreatha MartinFinish time: ____________ Date: ____________ Movements: ____________ Start time: ____________ Doreatha MartinFinish time: ____________ Date: ____________ Movements: ____________ Start time: ____________ Doreatha MartinFinish time: ____________ Date: ____________ Movements: ____________ Start time: ____________ Doreatha MartinFinish time: ____________  Date: ____________ Movements: ____________ Start time: ____________ Doreatha MartinFinish time: ____________ Date: ____________ Movements: ____________ Start time: ____________ Doreatha MartinFinish time: ____________ Date: ____________ Movements: ____________ Start time: ____________ Doreatha MartinFinish time: ____________ Date: ____________ Movements: ____________ Start time: ____________ Doreatha MartinFinish time: ____________ Date: ____________ Movements: ____________ Start time: ____________ Doreatha MartinFinish time: ____________ Date: ____________ Movements: ____________ Start time: ____________ Doreatha MartinFinish time: ____________ Date: ____________ Movements: ____________ Start time: ____________ Doreatha MartinFinish time: ____________  Date: ____________ Movements: ____________ Start time: ____________ Doreatha MartinFinish time:  ____________ Date: ____________ Movements: ____________ Start time: ____________ Doreatha MartinFinish time: ____________ Date: ____________ Movements: ____________ Start time: ____________ Doreatha MartinFinish time: ____________ Date: ____________ Movements: ____________ Start time: ____________ Doreatha MartinFinish time: ____________ Date: ____________ Movements: ____________ Start time: ____________ Doreatha MartinFinish time: ____________ Date: ____________ Movements: ____________ Start time: ____________ Doreatha MartinFinish time: ____________ Date: ____________ Movements: ____________ Start time: ____________ Doreatha MartinFinish time: ____________  Date: ____________ Movements: ____________ Start time: ____________ Doreatha MartinFinish time: ____________ Date: ____________ Movements: ____________ Start time: ____________ Doreatha MartinFinish time: ____________ Date: ____________ Movements: ____________ Start time: ____________ Doreatha MartinFinish time: ____________ Date: ____________ Movements: ____________ Start time: ____________ Doreatha MartinFinish time: ____________ Date: ____________ Movements: ____________ Start time: ____________ Doreatha MartinFinish time: ____________ Date: ____________ Movements: ____________ Start time: ____________ Doreatha MartinFinish time: ____________   This information is not intended to replace advice given to you by your health care provider. Make sure you discuss any questions you have with your health care provider.   Document Released: 04/30/2006 Document Revised: 04/21/2014 Document Reviewed: 01/26/2012 Elsevier Interactive Patient Education 2016 [employer] Pt instructed to keep next scheduled appt with MD. Pt ready to leave department in stable condition ambulatory.  C Rylynne Schicker.  RNC

## 2015-04-20 NOTE — Plan of Care (Signed)
[id number] [facility] per Dr. Valentino Saxonherry order. MD states that baby was too active to monitor in office. EFM applied and instructed to mother.  Ellison Carwin Maclovia Uher RNC

## 2015-04-22 LAB — CULTURE, BETA STREP (GROUP B ONLY): STREP GP B CULTURE: NEGATIVE

## 2015-04-23 ENCOUNTER — Ambulatory Visit (INDEPENDENT_AMBULATORY_CARE_PROVIDER_SITE_OTHER): Payer: Medicaid Other | Admitting: Obstetrics and Gynecology

## 2015-04-23 ENCOUNTER — Other Ambulatory Visit: Payer: Medicaid Other

## 2015-04-23 VITALS — BP 107/53 | HR 85 | Wt 183.7 lb

## 2015-04-23 DIAGNOSIS — Z3493 Encounter for supervision of normal pregnancy, unspecified, third trimester: Secondary | ICD-10-CM

## 2015-04-23 DIAGNOSIS — O24419 Gestational diabetes mellitus in pregnancy, unspecified control: Secondary | ICD-10-CM | POA: Diagnosis not present

## 2015-04-23 DIAGNOSIS — O09523 Supervision of elderly multigravida, third trimester: Secondary | ICD-10-CM

## 2015-04-23 DIAGNOSIS — Z3483 Encounter for supervision of other normal pregnancy, third trimester: Secondary | ICD-10-CM

## 2015-04-23 LAB — POCT URINALYSIS DIPSTICK
BILIRUBIN UA: NEGATIVE
GLUCOSE UA: NEGATIVE
Ketones, UA: NEGATIVE
NITRITE UA: NEGATIVE
Protein, UA: NEGATIVE
RBC UA: NEGATIVE
Spec Grav, UA: <=1.005
Urobilinogen, UA: 0.2
pH, UA: 7.5

## 2015-04-23 NOTE — Progress Notes (Signed)
NONSTRESS TEST INTERPRETATION  INDICATIONS:  GDM,AMA  FHR baseline: 130s RESULTS:Reactive COMMENTS:    PLAN: 1. Continue fetal kick counts twice a day. 2. Continue antepartum testing as scheduled-Biweekly 3.  Continue Glyburide 10 mg BID  Darol Destinerystal Miller, CMA  Herold HarmsMartin A Defrancesco, MD

## 2015-04-26 ENCOUNTER — Encounter: Payer: Medicaid Other | Admitting: Obstetrics and Gynecology

## 2015-04-26 ENCOUNTER — Encounter: Payer: Self-pay | Admitting: Obstetrics and Gynecology

## 2015-04-26 ENCOUNTER — Ambulatory Visit
Admission: RE | Admit: 2015-04-26 | Discharge: 2015-04-26 | Disposition: A | Discharge status: Home / Self Care | Payer: Medicaid Other | Source: Ambulatory Visit | Attending: Obstetrics and Gynecology | Admitting: Obstetrics and Gynecology

## 2015-04-26 ENCOUNTER — Other Ambulatory Visit: Payer: Self-pay

## 2015-04-26 ENCOUNTER — Ambulatory Visit (INDEPENDENT_AMBULATORY_CARE_PROVIDER_SITE_OTHER): Payer: Medicaid Other | Admitting: Obstetrics and Gynecology

## 2015-04-26 VITALS — BP 97/61 | HR 93 | Wt 183.6 lb

## 2015-04-26 VITALS — BP 110/65 | HR 80 | Temp 97.8°F | Resp 18 | Wt 181.6 lb

## 2015-04-26 DIAGNOSIS — Z0189 Encounter for other specified special examinations: Secondary | ICD-10-CM | POA: Diagnosis present

## 2015-04-26 DIAGNOSIS — Z9119 Patient's noncompliance with other medical treatment and regimen: Secondary | ICD-10-CM

## 2015-04-26 DIAGNOSIS — Z3493 Encounter for supervision of normal pregnancy, unspecified, third trimester: Secondary | ICD-10-CM

## 2015-04-26 DIAGNOSIS — O24419 Gestational diabetes mellitus in pregnancy, unspecified control: Secondary | ICD-10-CM | POA: Insufficient documentation

## 2015-04-26 DIAGNOSIS — B373 Candidiasis of vulva and vagina: Secondary | ICD-10-CM

## 2015-04-26 DIAGNOSIS — O09522 Supervision of elderly multigravida, second trimester: Secondary | ICD-10-CM | POA: Diagnosis not present

## 2015-04-26 DIAGNOSIS — Z3A35 35 weeks gestation of pregnancy: Secondary | ICD-10-CM | POA: Diagnosis not present

## 2015-04-26 DIAGNOSIS — O34219 Maternal care for unspecified type scar from previous cesarean delivery: Secondary | ICD-10-CM

## 2015-04-26 DIAGNOSIS — B3731 Acute candidiasis of vulva and vagina: Secondary | ICD-10-CM

## 2015-04-26 DIAGNOSIS — O26843 Uterine size-date discrepancy, third trimester: Secondary | ICD-10-CM | POA: Insufficient documentation

## 2015-04-26 DIAGNOSIS — Z91199 Patient's noncompliance with other medical treatment and regimen due to unspecified reason: Secondary | ICD-10-CM

## 2015-04-26 DIAGNOSIS — O99210 Obesity complicating pregnancy, unspecified trimester: Secondary | ICD-10-CM

## 2015-04-26 LAB — POCT URINALYSIS DIPSTICK
BILIRUBIN UA: 1
Glucose, UA: NEGATIVE
KETONE: NEGATIVE
NITRITE UA: NEGATIVE
PH UA: 6
RBC UA: NEGATIVE
Spec Grav, UA: >=1.03
Urobilinogen, UA: 0.2

## 2015-04-26 LAB — GLUCOSE, CAPILLARY: Glucose-Capillary: 76 mg/dL (ref 65–99)

## 2015-04-26 NOTE — Progress Notes (Signed)
Rob and nst- see [facility] today- c/o of white d/c- itching and burning- pos foul odor. vuvla more irritated. Wet prep-negative/Negative.  Nystatin/triamcinolone cream to be applied topically twice a day for 10 days prescribed  NONSTRESS TEST INTERPRETATION  INDICATIONS: gdm A2; ama  FHR baseline: 130's RESULTS:Reactive; Mild variables COMMENTS: Blood glucose poorly controlled;; Follow-up At [facility] today   PLAN: 1. Continue fetal kick counts twice a day. 2. Continue antepartum testing as scheduled-Biweekly; AFI weekly 3.  [facility]  (to be determined today) 4. Glyburide 10 mg BID  Darol Destinerystal Miller, CMA Herold HarmsMartin A Defrancesco, MD

## 2015-04-27 ENCOUNTER — Telehealth: Payer: Self-pay | Admitting: Obstetrics and Gynecology

## 2015-04-27 NOTE — Telephone Encounter (Signed)
Zella BallRobin, thanks for reminding me of this. We are not going to be able to accommodate her 10 am needs. She may come at 8:15am or 9:15am on 05/03/15 or 9:45am or 10:00am on 05/04/2015. Please reiterate to the patient that if she is late she may not be seen or may have to be worked back in. I don;t control when c-sections are so you may want to talk with Dr. Valentino Saxonherry regarding that. Thanks.

## 2015-04-27 NOTE — Telephone Encounter (Signed)
heyyyy Stacy Best was here yesterday and neds an appt for 1/19 for 1 wk rob. i wanted you to look at schedule and see where to put her. She said after 10 am and she needs an NST too. Also she said she wanted her c-section Monday the 23rd. Let me know

## 2015-04-30 ENCOUNTER — Ambulatory Visit (INDEPENDENT_AMBULATORY_CARE_PROVIDER_SITE_OTHER): Payer: Medicaid Other | Admitting: Obstetrics and Gynecology

## 2015-04-30 VITALS — BP 95/67 | HR 85 | Wt 186.0 lb

## 2015-04-30 DIAGNOSIS — O99210 Obesity complicating pregnancy, unspecified trimester: Secondary | ICD-10-CM | POA: Diagnosis not present

## 2015-04-30 DIAGNOSIS — Z369 Encounter for antenatal screening, unspecified: Secondary | ICD-10-CM

## 2015-04-30 DIAGNOSIS — O24419 Gestational diabetes mellitus in pregnancy, unspecified control: Secondary | ICD-10-CM | POA: Diagnosis not present

## 2015-04-30 DIAGNOSIS — O09523 Supervision of elderly multigravida, third trimester: Secondary | ICD-10-CM

## 2015-04-30 DIAGNOSIS — Z36 Encounter for antenatal screening of mother: Secondary | ICD-10-CM

## 2015-04-30 DIAGNOSIS — Z1389 Encounter for screening for other disorder: Secondary | ICD-10-CM

## 2015-04-30 DIAGNOSIS — O34219 Maternal care for unspecified type scar from previous cesarean delivery: Secondary | ICD-10-CM

## 2015-04-30 LAB — POCT URINALYSIS DIPSTICK
Bilirubin, UA: NEGATIVE
GLUCOSE UA: NEGATIVE
Ketones, UA: NEGATIVE
Leukocytes, UA: NEGATIVE
NITRITE UA: NEGATIVE
PH UA: 6
PROTEIN UA: NEGATIVE
RBC UA: NEGATIVE
Spec Grav, UA: 1.015
UROBILINOGEN UA: NEGATIVE

## 2015-04-30 NOTE — Progress Notes (Signed)
   NONSTRESS TEST INTERPRETATION  INDICATIONS: GDM A2, AMA  FHR baseline: 140-150 RESULTS: reactive COMMENTS:    PLAN: 1. Continue fetal kick counts twice a day. 2. Continue antepartum testing as scheduled-Biweekly, AFI weekly 3.  Glyburide 10mg  BID  Fenton Mallingebbie Kaydenn Mclear, LPN

## 2015-05-01 NOTE — Progress Notes (Signed)
I have reviewed the information as documented by Fenton Malling, LPN.  NST performed today was reviewed and was found to be reactive.  Continue recommended antenatal testing and prenatal care.

## 2015-05-02 NOTE — Telephone Encounter (Signed)
Contacted patient regarding scheduling.  Reviewed ultrasound and recommendations made by [facility] regarding timing of delivery secondary to uncontrolled gestational diabetes.  Based on most recent scan, AFI normal, EFW 6 lb 14 oz (84%ile), with AC in 96%ile. Recommend delivery after 37 weeks. Patient will be scheduled for 05/10/2015.  Will need to reschedule appointment for next week for pre-op/OB appointment (05/07/2014), and also needs another NST this week (either tomorrow or the following day, to be scheduled at [facility] L&D due to difficulty in tracing due to body habitus in office).

## 2015-05-04 ENCOUNTER — Encounter: Payer: Self-pay | Admitting: *Deleted

## 2015-05-04 ENCOUNTER — Inpatient Hospital Stay
Admission: RE | Admit: 2015-05-04 | Discharge: 2015-05-04 | Disposition: A | Discharge status: Home / Self Care | Payer: Medicaid Other | Attending: Obstetrics and Gynecology | Admitting: Obstetrics and Gynecology

## 2015-05-04 DIAGNOSIS — Z3493 Encounter for supervision of normal pregnancy, unspecified, third trimester: Secondary | ICD-10-CM | POA: Insufficient documentation

## 2015-05-04 DIAGNOSIS — Z3A37 37 weeks gestation of pregnancy: Secondary | ICD-10-CM | POA: Insufficient documentation

## 2015-05-04 NOTE — OB Triage Note (Signed)
Scheduled NST 

## 2015-05-04 NOTE — Discharge Instructions (Signed)
Call your provider for any other concerns. °

## 2015-05-07 ENCOUNTER — Observation Stay
Admission: RE | Admit: 2015-05-07 | Discharge: 2015-05-07 | Disposition: A | Discharge status: Home / Self Care | Payer: Medicaid Other | Source: Ambulatory Visit | Attending: Obstetrics and Gynecology | Admitting: Obstetrics and Gynecology

## 2015-05-07 ENCOUNTER — Telehealth: Payer: Self-pay | Admitting: Obstetrics and Gynecology

## 2015-05-07 ENCOUNTER — Encounter: Payer: Self-pay | Admitting: *Deleted

## 2015-05-07 DIAGNOSIS — Z23 Encounter for immunization: Secondary | ICD-10-CM | POA: Diagnosis not present

## 2015-05-07 DIAGNOSIS — Z3A37 37 weeks gestation of pregnancy: Secondary | ICD-10-CM | POA: Diagnosis not present

## 2015-05-07 DIAGNOSIS — O99213 Obesity complicating pregnancy, third trimester: Secondary | ICD-10-CM | POA: Insufficient documentation

## 2015-05-07 DIAGNOSIS — O24415 Gestational diabetes mellitus in pregnancy, controlled by oral hypoglycemic drugs: Principal | ICD-10-CM | POA: Insufficient documentation

## 2015-05-07 DIAGNOSIS — Z3490 Encounter for supervision of normal pregnancy, unspecified, unspecified trimester: Secondary | ICD-10-CM

## 2015-05-07 DIAGNOSIS — O34219 Maternal care for unspecified type scar from previous cesarean delivery: Secondary | ICD-10-CM | POA: Insufficient documentation

## 2015-05-07 DIAGNOSIS — Z3A00 Weeks of gestation of pregnancy not specified: Secondary | ICD-10-CM | POA: Diagnosis not present

## 2015-05-07 DIAGNOSIS — N898 Other specified noninflammatory disorders of vagina: Secondary | ICD-10-CM

## 2015-05-07 DIAGNOSIS — Z6835 Body mass index (BMI) 35.0-35.9, adult: Secondary | ICD-10-CM | POA: Insufficient documentation

## 2015-05-07 DIAGNOSIS — E669 Obesity, unspecified: Secondary | ICD-10-CM | POA: Diagnosis not present

## 2015-05-07 DIAGNOSIS — O09523 Supervision of elderly multigravida, third trimester: Secondary | ICD-10-CM | POA: Diagnosis not present

## 2015-05-07 MED ORDER — NYSTATIN-TRIAMCINOLONE 100000-0.1 UNIT/GM-% EX OINT: 1.0000 "application " | TOPICAL_OINTMENT | Freq: Two times a day (BID) | CUTANEOUS | Status: DC

## 2015-05-07 MED ORDER — TERBUTALINE SULFATE 1 MG/ML IJ SOLN
INTRAMUSCULAR | Status: AC
  Administered 2015-05-07: 0.25 mg via SUBCUTANEOUS
  Filled 2015-05-07: qty 1

## 2015-05-07 MED ORDER — TERBUTALINE SULFATE 1 MG/ML IJ SOLN
0.2500 mg | Freq: Once | INTRAMUSCULAR | Status: AC
  Administered 2015-05-07: 0.25 mg via SUBCUTANEOUS

## 2015-05-07 MED ORDER — FLUCONAZOLE 50 MG PO TABS
150.0000 mg | ORAL_TABLET | Freq: Once | ORAL | Status: AC
  Administered 2015-05-07: 150 mg via ORAL
  Filled 2015-05-07: qty 3

## 2015-05-07 MED ORDER — TERCONAZOLE 0.8 % VA CREA: 1.0000 | TOPICAL_CREAM | Freq: Every day | VAGINAL | Status: DC

## 2015-05-07 MED ORDER — INFLUENZA VAC SPLIT QUAD 0.5 ML IM SUSY
0.5000 mL | PREFILLED_SYRINGE | Freq: Once | INTRAMUSCULAR | Status: AC
  Administered 2015-05-07: 0.5 mL via INTRAMUSCULAR
  Filled 2015-05-07: qty 0.5

## 2015-05-07 NOTE — Telephone Encounter (Signed)
Per Dr.De's note rx for nystatin/triamcinolone cream was to be sent to pt's pharmacy, was not. Will send RX today.

## 2015-05-07 NOTE — Telephone Encounter (Signed)
PT CALLED AND SHE SAW DR DE LAST WEEK AND HE MENTIONED SOME CREAM FOR HER TO USE FOR OUTSIDE VAGINAL ITCHING, AND IT WAS NEVER CALLED IN SO SHE WAS WANTING IT TO BE CALLED IN AND OF COURSE SHE DOESN'T KNOW WHAT IT WAS CALLED.

## 2015-05-07 NOTE — OB Triage Note (Signed)
Scheduled NST, gestational diabetes. Stacy Best

## 2015-05-07 NOTE — Telephone Encounter (Signed)
PT CALLED AND LAST TIME SHE SAW DR DE AND HE MENTIONED SOME CREAM FOR HER TO USE FOR SOME ITCHING

## 2015-05-07 NOTE — Final Progress Note (Signed)
L&D OB Triage Note  HPI:  Stacy Best is a 35 y.o. [phone] female at [redacted]w[redacted]d. Estimated Date of Delivery: 05/27/15 who presents for scheduled NST for gestational diabetes on oral hypoglycemics (poorly controlled).    ROS:  Review of Systems - contractions q 3-5 min.  Denies leakage of fluids, vaginal bleeding, decreased fetal movement.   Physical Exam:  Blood pressure 78/52, pulse 98, temperature 98.2 F (36.8 C), temperature source Oral, resp. rate 16, height 5' (1.524 m), weight 181 lb (82.101 kg), last menstrual period 08/20/2014. General appearance: alert and no distress, obese Abdomen: soft, non-tender; bowel sounds normal; no masses,  no organomegaly.  Pelvic: external genitalia normal and positive findings: vaginal discharge:  white, curd-like and odorless.  Cervix 1.5/50/-3, changed to 2.5/70/-3 after 2 hrs. Unable to determine fetal presentation on exam.  Extremities: extremities normal, atraumatic, no cyanosis or edema   NST INTERPRETATION:  Indications: gestational diabetes mellitus and rule out uterine contractions  Mode: External (reapplied, assessing) Baseline Rate (A): 140 bpm Variability: Moderate Accelerations: 15 x 15 Decelerations: None     Contraction Frequency (min): 4-6   Impression: reactive   Assessment:  35 y.o. [id number] at [redacted]w[redacted]d with:  1. Early labor 2. Gestational diabetes on oral hypoglycemics (Glyburide), poorly controlled 3. Obesity  4. Undetermined presentation  Plan:  1. Patient given 1 dose of terbutaline, which helped to space out contractions. No additional cervical change after an additional 1.5 hrs of monitoring.  2. Patient able to d/c home.  Scheduled for repeat C-section in 3 days.   3. Unable to determine presentation on exam, suspected breech.  Patient for C-section in 3 days.  4. Continue glyburide, encouraged compliance.   Hildred Laser, MD [facility]

## 2015-05-08 ENCOUNTER — Encounter: Payer: Self-pay | Admitting: Obstetrics and Gynecology

## 2015-05-08 ENCOUNTER — Ambulatory Visit (INDEPENDENT_AMBULATORY_CARE_PROVIDER_SITE_OTHER): Payer: Medicaid Other | Admitting: Obstetrics and Gynecology

## 2015-05-08 ENCOUNTER — Encounter: Payer: Medicaid Other | Admitting: Obstetrics and Gynecology

## 2015-05-08 VITALS — BP 111/73 | HR 89 | Wt 187.0 lb

## 2015-05-08 DIAGNOSIS — O34219 Maternal care for unspecified type scar from previous cesarean delivery: Secondary | ICD-10-CM

## 2015-05-08 DIAGNOSIS — Z9119 Patient's noncompliance with other medical treatment and regimen: Secondary | ICD-10-CM

## 2015-05-08 DIAGNOSIS — O24419 Gestational diabetes mellitus in pregnancy, unspecified control: Secondary | ICD-10-CM

## 2015-05-08 DIAGNOSIS — O99210 Obesity complicating pregnancy, unspecified trimester: Secondary | ICD-10-CM

## 2015-05-08 DIAGNOSIS — Z91199 Patient's noncompliance with other medical treatment and regimen due to unspecified reason: Secondary | ICD-10-CM

## 2015-05-08 DIAGNOSIS — O09523 Supervision of elderly multigravida, third trimester: Secondary | ICD-10-CM

## 2015-05-08 LAB — POCT URINALYSIS DIPSTICK
Bilirubin, UA: NEGATIVE
GLUCOSE UA: NEGATIVE
Ketones, UA: NEGATIVE
LEUKOCYTES UA: NEGATIVE
NITRITE UA: NEGATIVE
Protein, UA: NEGATIVE
RBC UA: NEGATIVE
Spec Grav, UA: 1.02
UROBILINOGEN UA: NEGATIVE
pH, UA: 6.5

## 2015-05-08 NOTE — Progress Notes (Signed)
ROB: Patient seen in triage yesterday for NST, was noted to have painful contractions.  Cervix changed from 2 to 2.5 cm. Given dose of terbutaline with resolution of contractions. Also treated with Diflucan and prescribed Terazol 7 for persistent vulvar irritation and suspected yeast vaginitis on exam Patient scheduled for repeat C-section on 05/10/15.  Patient now notes that she desires BTL.  Discussed need for signing BTL Medicaid papers, and required waiting period of 30 days, so cannot be performed at time of C-section.  Patient notes understanding. Fetal presentation undeterminable today but feels breech (heart tones also appreciated higher up in abdomen). Pre-op done today. Advised on NPO status after midnight day prior to surgery and to hold evening dose of Glyburide as patient is now noting early morning lows (BS 50s-60s) and having to have a snack during middle of the night.

## 2015-05-09 ENCOUNTER — Encounter
Admission: RE | Admit: 2015-05-09 | Discharge: 2015-05-09 | Disposition: A | Discharge status: Home / Self Care | Payer: Medicaid Other | Source: Ambulatory Visit | Attending: Obstetrics and Gynecology | Admitting: Obstetrics and Gynecology

## 2015-05-09 HISTORY — DX: Headache: R51

## 2015-05-09 HISTORY — DX: Headache, unspecified: R51.9

## 2015-05-09 LAB — TYPE AND SCREEN
ABO/RH(D): O POS
ANTIBODY SCREEN: NEGATIVE
EXTEND SAMPLE REASON: UNDETERMINED

## 2015-05-09 LAB — CBC
HCT: 36.4 % (ref 35.0–47.0)
Hemoglobin: 12.1 g/dL (ref 12.0–16.0)
MCH: 29.3 pg (ref 26.0–34.0)
MCHC: 33.4 g/dL (ref 32.0–36.0)
MCV: 87.7 fL (ref 80.0–100.0)
PLATELETS: 147 10*3/uL — AB (ref 150–440)
RBC: 4.15 MIL/uL (ref 3.80–5.20)
RDW: 15.6 % — ABNORMAL HIGH (ref 11.5–14.5)
WBC: 6.2 10*3/uL (ref 3.6–11.0)

## 2015-05-09 LAB — ABO/RH: ABO/RH(D): O POS

## 2015-05-10 ENCOUNTER — Inpatient Hospital Stay
Admission: RE | Admit: 2015-05-10 | Discharge: 2015-05-12 | DRG: 765 | Disposition: A | Discharge status: Home / Self Care | Payer: Medicaid Other | Source: Ambulatory Visit | Attending: Obstetrics and Gynecology | Admitting: Obstetrics and Gynecology

## 2015-05-10 ENCOUNTER — Inpatient Hospital Stay: Payer: Medicaid Other | Admitting: Anesthesiology

## 2015-05-10 ENCOUNTER — Encounter: Payer: Self-pay | Admitting: Anesthesiology

## 2015-05-10 ENCOUNTER — Encounter
Admission: RE | Disposition: A | Discharge status: Home / Self Care | Payer: Self-pay | Source: Ambulatory Visit | Attending: Obstetrics and Gynecology

## 2015-05-10 DIAGNOSIS — O99210 Obesity complicating pregnancy, unspecified trimester: Secondary | ICD-10-CM | POA: Diagnosis present

## 2015-05-10 DIAGNOSIS — O99214 Obesity complicating childbirth: Secondary | ICD-10-CM | POA: Diagnosis present

## 2015-05-10 DIAGNOSIS — D696 Thrombocytopenia, unspecified: Secondary | ICD-10-CM | POA: Diagnosis present

## 2015-05-10 DIAGNOSIS — O3663X0 Maternal care for excessive fetal growth, third trimester, not applicable or unspecified: Secondary | ICD-10-CM | POA: Diagnosis present

## 2015-05-10 DIAGNOSIS — O34211 Maternal care for low transverse scar from previous cesarean delivery: Secondary | ICD-10-CM | POA: Diagnosis present

## 2015-05-10 DIAGNOSIS — O09529 Supervision of elderly multigravida, unspecified trimester: Secondary | ICD-10-CM

## 2015-05-10 DIAGNOSIS — O34219 Maternal care for unspecified type scar from previous cesarean delivery: Secondary | ICD-10-CM | POA: Diagnosis present

## 2015-05-10 DIAGNOSIS — O24419 Gestational diabetes mellitus in pregnancy, unspecified control: Secondary | ICD-10-CM

## 2015-05-10 DIAGNOSIS — O9912 Other diseases of the blood and blood-forming organs and certain disorders involving the immune mechanism complicating childbirth: Secondary | ICD-10-CM | POA: Diagnosis present

## 2015-05-10 DIAGNOSIS — O24429 Gestational diabetes mellitus in childbirth, unspecified control: Secondary | ICD-10-CM | POA: Diagnosis present

## 2015-05-10 DIAGNOSIS — Z9119 Patient's noncompliance with other medical treatment and regimen: Secondary | ICD-10-CM

## 2015-05-10 DIAGNOSIS — Z3493 Encounter for supervision of normal pregnancy, unspecified, third trimester: Secondary | ICD-10-CM | POA: Diagnosis not present

## 2015-05-10 DIAGNOSIS — O09523 Supervision of elderly multigravida, third trimester: Secondary | ICD-10-CM | POA: Diagnosis not present

## 2015-05-10 DIAGNOSIS — O329XX0 Maternal care for malpresentation of fetus, unspecified, not applicable or unspecified: Secondary | ICD-10-CM | POA: Diagnosis present

## 2015-05-10 DIAGNOSIS — D649 Anemia, unspecified: Secondary | ICD-10-CM | POA: Diagnosis not present

## 2015-05-10 DIAGNOSIS — O3660X0 Maternal care for excessive fetal growth, unspecified trimester, not applicable or unspecified: Secondary | ICD-10-CM

## 2015-05-10 DIAGNOSIS — Z98891 History of uterine scar from previous surgery: Secondary | ICD-10-CM

## 2015-05-10 DIAGNOSIS — O99119 Other diseases of the blood and blood-forming organs and certain disorders involving the immune mechanism complicating pregnancy, unspecified trimester: Secondary | ICD-10-CM

## 2015-05-10 DIAGNOSIS — Z91199 Patient's noncompliance with other medical treatment and regimen due to unspecified reason: Secondary | ICD-10-CM

## 2015-05-10 DIAGNOSIS — O9081 Anemia of the puerperium: Secondary | ICD-10-CM | POA: Diagnosis not present

## 2015-05-10 DIAGNOSIS — Z3A37 37 weeks gestation of pregnancy: Secondary | ICD-10-CM

## 2015-05-10 LAB — GLUCOSE, CAPILLARY
GLUCOSE-CAPILLARY: 67 mg/dL (ref 65–99)
GLUCOSE-CAPILLARY: 97 mg/dL (ref 65–99)
Glucose-Capillary: 73 mg/dL (ref 65–99)

## 2015-05-10 LAB — RPR: RPR Ser Ql: NONREACTIVE

## 2015-05-10 SURGERY — Surgical Case
Anesthesia: Spinal

## 2015-05-10 MED ORDER — KETOROLAC TROMETHAMINE 30 MG/ML IJ SOLN
30.0000 mg | Freq: Four times a day (QID) | INTRAMUSCULAR | Status: DC | PRN
Start: 2015-05-10 — End: 2015-05-10

## 2015-05-10 MED ORDER — ERYTHROMYCIN 5 MG/GM OP OINT: 1.0000 "application " | TOPICAL_OINTMENT | Freq: Once | OPHTHALMIC | Status: DC

## 2015-05-10 MED ORDER — TETANUS-DIPHTH-ACELL PERTUSSIS 5-2.5-18.5 LF-MCG/0.5 IM SUSP: 0.5000 mL | Freq: Once | INTRAMUSCULAR | Status: DC

## 2015-05-10 MED ORDER — NALBUPHINE HCL 10 MG/ML IJ SOLN: 5.0000 mg | INTRAMUSCULAR | Status: DC | PRN

## 2015-05-10 MED ORDER — LANOLIN HYDROUS EX OINT: 1.0000 "application " | TOPICAL_OINTMENT | CUTANEOUS | Status: DC | PRN

## 2015-05-10 MED ORDER — INSULIN ASPART 100 UNIT/ML ~~LOC~~ SOLN
0.0000 [IU] | Freq: Three times a day (TID) | SUBCUTANEOUS | Status: DC
  Administered 2015-05-10: 1 [IU] via SUBCUTANEOUS
  Filled 2015-05-10: qty 1

## 2015-05-10 MED ORDER — SODIUM CHLORIDE 0.9 % IR SOLN
Status: DC | PRN
  Administered 2015-05-10: 800 mL

## 2015-05-10 MED ORDER — BUPIVACAINE IN DEXTROSE 0.75-8.25 % IT SOLN
INTRATHECAL | Status: DC | PRN
  Administered 2015-05-10: 1.6 mL via INTRATHECAL

## 2015-05-10 MED ORDER — KETOROLAC TROMETHAMINE 30 MG/ML IJ SOLN
30.0000 mg | Freq: Four times a day (QID) | INTRAMUSCULAR | Status: DC | PRN
  Administered 2015-05-10: 30 mg via INTRAVENOUS
  Filled 2015-05-10: qty 1

## 2015-05-10 MED ORDER — LACTATED RINGERS IV SOLN
INTRAVENOUS | Status: DC
  Administered 2015-05-10 (×2): via INTRAVENOUS

## 2015-05-10 MED ORDER — SODIUM CHLORIDE 0.9% FLUSH
3.0000 mL | INTRAVENOUS | Status: DC | PRN
Start: 2015-05-10 — End: 2015-05-10

## 2015-05-10 MED ORDER — DIPHENHYDRAMINE HCL 50 MG/ML IJ SOLN: 12.5000 mg | INTRAMUSCULAR | Status: DC | PRN

## 2015-05-10 MED ORDER — NALOXONE HCL 0.4 MG/ML IJ SOLN: 0.4000 mg | INTRAMUSCULAR | Status: DC | PRN

## 2015-05-10 MED ORDER — HEPATITIS B VAC RECOMBINANT 10 MCG/0.5ML IJ SUSP: 0.5000 mL | Freq: Once | INTRAMUSCULAR | Status: DC

## 2015-05-10 MED ORDER — NALBUPHINE HCL 10 MG/ML IJ SOLN: 5.0000 mg | Freq: Once | INTRAMUSCULAR | Status: DC | PRN

## 2015-05-10 MED ORDER — MORPHINE SULFATE (PF) 0.5 MG/ML IJ SOLN
INTRAMUSCULAR | Status: DC | PRN
  Administered 2015-05-10: .1 mg via INTRATHECAL

## 2015-05-10 MED ORDER — ACETAMINOPHEN 500 MG PO TABS: 1000.0000 mg | ORAL_TABLET | Freq: Four times a day (QID) | ORAL | Status: DC

## 2015-05-10 MED ORDER — ACETAMINOPHEN 325 MG PO TABS: 650.0000 mg | ORAL_TABLET | ORAL | Status: DC | PRN

## 2015-05-10 MED ORDER — LIDOCAINE 5 % EX PTCH
1.0000 | MEDICATED_PATCH | CUTANEOUS | Status: DC
  Filled 2015-05-10: qty 1

## 2015-05-10 MED ORDER — OXYTOCIN 10 UNIT/ML IJ SOLN: 2.5000 [IU]/h | INTRAVENOUS | Status: AC

## 2015-05-10 MED ORDER — VITAMIN K1 1 MG/0.5ML IJ SOLN: 1.0000 mg | Freq: Once | INTRAMUSCULAR | Status: DC

## 2015-05-10 MED ORDER — LACTATED RINGERS IV BOLUS (SEPSIS)
1000.0000 mL | Freq: Once | INTRAVENOUS | Status: AC
Start: 2015-05-10 — End: 2015-05-10
  Administered 2015-05-10: 1000 mL via INTRAVENOUS

## 2015-05-10 MED ORDER — OXYCODONE-ACETAMINOPHEN 5-325 MG PO TABS
1.0000 | ORAL_TABLET | ORAL | Status: DC | PRN
  Filled 2015-05-10 (×2): qty 1

## 2015-05-10 MED ORDER — MENTHOL 3 MG MT LOZG: 1.0000 | LOZENGE | OROMUCOSAL | Status: DC | PRN

## 2015-05-10 MED ORDER — FENTANYL CITRATE (PF) 100 MCG/2ML IJ SOLN
INTRAMUSCULAR | Status: DC | PRN
  Administered 2015-05-10: 15 ug via INTRATHECAL

## 2015-05-10 MED ORDER — WITCH HAZEL-GLYCERIN EX PADS
1.0000 | MEDICATED_PAD | CUTANEOUS | Status: DC | PRN
Start: 2015-05-10 — End: 2015-05-12

## 2015-05-10 MED ORDER — DIPHENHYDRAMINE HCL 25 MG PO CAPS: 25.0000 mg | ORAL_CAPSULE | ORAL | Status: DC | PRN

## 2015-05-10 MED ORDER — LIDOCAINE 5 % EX PTCH
MEDICATED_PATCH | CUTANEOUS | Status: DC | PRN
  Administered 2015-05-10: 2 via TRANSDERMAL

## 2015-05-10 MED ORDER — SIMETHICONE 80 MG PO CHEW
80.0000 mg | CHEWABLE_TABLET | ORAL | Status: DC | PRN
  Administered 2015-05-10: 80 mg via ORAL
  Filled 2015-05-10: qty 1

## 2015-05-10 MED ORDER — PRENATAL MULTIVITAMIN CH
1.0000 | ORAL_TABLET | Freq: Every day | ORAL | Status: DC
  Administered 2015-05-11 – 2015-05-12 (×2): 1 via ORAL
  Filled 2015-05-10 (×2): qty 1

## 2015-05-10 MED ORDER — NALOXONE HCL 2 MG/2ML IJ SOSY
1.0000 ug/kg/h | PREFILLED_SYRINGE | INTRAVENOUS | Status: DC | PRN
  Filled 2015-05-10: qty 2

## 2015-05-10 MED ORDER — FERROUS SULFATE 325 (65 FE) MG PO TABS
325.0000 mg | ORAL_TABLET | Freq: Two times a day (BID) | ORAL | Status: DC
  Administered 2015-05-10 – 2015-05-12 (×4): 325 mg via ORAL
  Filled 2015-05-10 (×4): qty 1

## 2015-05-10 MED ORDER — SUCROSE 24% NICU/PEDS ORAL SOLUTION: 0.5000 mL | OROMUCOSAL | Status: DC | PRN

## 2015-05-10 MED ORDER — PHENYLEPHRINE HCL 10 MG/ML IJ SOLN
INTRAMUSCULAR | Status: DC | PRN
  Administered 2015-05-10: 50 ug via INTRAVENOUS

## 2015-05-10 MED ORDER — SENNOSIDES-DOCUSATE SODIUM 8.6-50 MG PO TABS
2.0000 | ORAL_TABLET | ORAL | Status: DC
  Administered 2015-05-11: 2 via ORAL
  Filled 2015-05-10: qty 2

## 2015-05-10 MED ORDER — ZOLPIDEM TARTRATE 5 MG PO TABS: 5.0000 mg | ORAL_TABLET | Freq: Every evening | ORAL | Status: DC | PRN

## 2015-05-10 MED ORDER — SIMETHICONE 80 MG PO CHEW
80.0000 mg | CHEWABLE_TABLET | ORAL | Status: DC
  Administered 2015-05-11: 80 mg via ORAL
  Filled 2015-05-10: qty 1

## 2015-05-10 MED ORDER — LACTATED RINGERS IV SOLN
INTRAVENOUS | Status: DC
  Administered 2015-05-10: 18:00:00 via INTRAVENOUS

## 2015-05-10 MED ORDER — ONDANSETRON HCL 4 MG/2ML IJ SOLN
INTRAMUSCULAR | Status: DC | PRN
  Administered 2015-05-10: 4 mg via INTRAVENOUS

## 2015-05-10 MED ORDER — CITRIC ACID-SODIUM CITRATE 334-500 MG/5ML PO SOLN
30.0000 mL | ORAL | Status: AC
  Administered 2015-05-10: 30 mL via ORAL
  Filled 2015-05-10: qty 30

## 2015-05-10 MED ORDER — DIPHENHYDRAMINE HCL 25 MG PO CAPS: 25.0000 mg | ORAL_CAPSULE | Freq: Four times a day (QID) | ORAL | Status: DC | PRN

## 2015-05-10 MED ORDER — ONDANSETRON HCL 4 MG/2ML IJ SOLN: 4.0000 mg | Freq: Three times a day (TID) | INTRAMUSCULAR | Status: DC | PRN

## 2015-05-10 MED ORDER — OXYCODONE-ACETAMINOPHEN 5-325 MG PO TABS
2.0000 | ORAL_TABLET | ORAL | Status: DC | PRN
  Administered 2015-05-10 – 2015-05-12 (×11): 2 via ORAL
  Filled 2015-05-10 (×11): qty 2

## 2015-05-10 MED ORDER — IBUPROFEN 600 MG PO TABS
600.0000 mg | ORAL_TABLET | Freq: Four times a day (QID) | ORAL | Status: DC
  Administered 2015-05-10 – 2015-05-12 (×7): 600 mg via ORAL
  Filled 2015-05-10 (×13): qty 1

## 2015-05-10 MED ORDER — OXYTOCIN 40 UNITS IN LACTATED RINGERS INFUSION - SIMPLE MED
INTRAVENOUS | Status: AC
  Filled 2015-05-10: qty 1000

## 2015-05-10 MED ORDER — DEXTROSE 5 % IV SOLN
3.0000 g | INTRAVENOUS | Status: DC
  Filled 2015-05-10 (×2): qty 3

## 2015-05-10 MED ORDER — DIBUCAINE 1 % RE OINT: 1.0000 "application " | TOPICAL_OINTMENT | RECTAL | Status: DC | PRN

## 2015-05-10 MED ORDER — MAGNESIUM HYDROXIDE 400 MG/5ML PO SUSP
30.0000 mL | ORAL | Status: DC | PRN
Start: 2015-05-10 — End: 2015-05-12
  Filled 2015-05-10: qty 30

## 2015-05-10 SURGICAL SUPPLY — 28 items
BAG COUNTER SPONGE EZ (MISCELLANEOUS) ×4 IMPLANT
CANISTER SUCT 3000ML (MISCELLANEOUS) ×3 IMPLANT
CHLORAPREP W/TINT 26ML (MISCELLANEOUS) ×6 IMPLANT
COUNTER SPONGE BAG EZ (MISCELLANEOUS) ×2
DRSG TELFA 3X8 NADH (GAUZE/BANDAGES/DRESSINGS) ×3 IMPLANT
ELECT REM PT RETURN 9FT ADLT (ELECTROSURGICAL) ×3
ELECTRODE REM PT RTRN 9FT ADLT (ELECTROSURGICAL) ×1 IMPLANT
GAUZE SPONGE 4X4 12PLY STRL (GAUZE/BANDAGES/DRESSINGS) ×3 IMPLANT
GLOVE BIO SURGEON STRL SZ 6 (GLOVE) ×3 IMPLANT
GLOVE BIO SURGEON STRL SZ8 (GLOVE) ×3 IMPLANT
GLOVE BIOGEL PI IND STRL 6.5 (GLOVE) ×3 IMPLANT
GLOVE BIOGEL PI INDICATOR 6.5 (GLOVE) ×6
GOWN STRL REUS W/ TWL LRG LVL3 (GOWN DISPOSABLE) ×3 IMPLANT
GOWN STRL REUS W/TWL LRG LVL3 (GOWN DISPOSABLE) ×6
KIT RM TURNOVER STRD PROC AR (KITS) ×3 IMPLANT
MARKER SKIN DUAL TIP RULER LAB (MISCELLANEOUS) ×3 IMPLANT
NS IRRIG 1000ML POUR BTL (IV SOLUTION) ×3 IMPLANT
PACK C SECTION AR (MISCELLANEOUS) ×3 IMPLANT
PAD OB MATERNITY 4.3X12.25 (PERSONAL CARE ITEMS) ×3 IMPLANT
PAD PREP 24X41 OB/GYN DISP (PERSONAL CARE ITEMS) ×3 IMPLANT
STAPLER INSORB 30 2030 C-SECTI (MISCELLANEOUS) ×3 IMPLANT
SUT CHROMIC 0 CT 1 (SUTURE) IMPLANT
SUT MNCRL AB 4-0 PS2 18 (SUTURE) ×3 IMPLANT
SUT PLAIN 2 0 XLH (SUTURE) ×3 IMPLANT
SUT VIC AB 0 CT1 36 (SUTURE) IMPLANT
SUT VIC AB 1 CT1 36 (SUTURE) ×12 IMPLANT
SUT VIC AB 3-0 SH 27 (SUTURE)
SUT VIC AB 3-0 SH 27X BRD (SUTURE) IMPLANT

## 2015-05-10 NOTE — Plan of Care (Signed)
Pt here for scheduled c/section this morning per Dr. Valentino Saxon.  Pt is on monitor in OBS 3.  To prepare for c/section. Discussing plan of care with parents.  Dr. Randa Ngo, Ermalinda Memos, here for assessment prior to OR.  Ellison Carwin RNC

## 2015-05-10 NOTE — Anesthesia Procedure Notes (Signed)
Spinal Patient location during procedure: OR Start time: 05/10/2015 9:16 AM End time: 05/10/2015 9:16 AM Staffing Anesthesiologist: Katy Fitch K Performed by: anesthesiologist  Preanesthetic Checklist Completed: patient identified, site marked, surgical consent, pre-op evaluation, timeout performed, IV checked, risks and benefits discussed and monitors and equipment checked Spinal Block Patient position: sitting Prep: Betadine Patient monitoring: heart rate, continuous pulse ox, blood pressure and cardiac monitor Approach: midline Location: L4-5 Injection technique: single-shot Needle Needle type: Whitacre and Introducer  Needle gauge: 24 G Needle length: 9 cm Assessment Sensory level: T6 Additional Notes Negative paresthesia. Negative blood return. Positive free-flowing CSF. Expiration date of kit checked and confirmed. Patient tolerated procedure well, without complications.

## 2015-05-10 NOTE — Transfer of Care (Signed)
Immediate Anesthesia Transfer of Care Note  Patient: Stacy Best  Procedure(s) Performed: Procedure(s) with comments: REPEAT C SECTION (N/A) - PUT ON THE BOOKS WITH L&D FOR 7:30AM  Patient Location: PACU  Anesthesia Type:Spinal  Level of Consciousness: awake, oriented and patient cooperative  Airway & Oxygen Therapy: Patient Spontanous Breathing and Patient connected to nasal cannula oxygen  Post-op Assessment: Report given to RN and Post -op Vital signs reviewed and stable  Post vital signs: Reviewed and stable  Last Vitals:  Filed Vitals:   05/10/15 0755  BP: 116/7  Temp: 36.9 C  Resp: 18    Complications: No apparent anesthesia complications

## 2015-05-10 NOTE — Anesthesia Preprocedure Evaluation (Signed)
Anesthesia Evaluation  Patient identified by MRN, date of birth, ID band Patient awake    Reviewed: Allergy & Precautions, H&P , NPO status , Patient's Chart, lab work & pertinent test results  History of Anesthesia Complications Negative for: history of anesthetic complications  Airway Mallampati: III  TM Distance: >3 FB Neck ROM: full    Dental  (+) Poor Dentition   Pulmonary neg shortness of breath,    Pulmonary exam normal breath sounds clear to auscultation       Cardiovascular Exercise Tolerance: Good (-) hypertensionnegative cardio ROS Normal cardiovascular exam Rhythm:regular Rate:Normal     Neuro/Psych  Headaches, negative psych ROS   GI/Hepatic negative GI ROS, Neg liver ROS,   Endo/Other  diabetes  Renal/GU negative Renal ROS  negative genitourinary   Musculoskeletal   Abdominal   Peds  Hematology negative hematology ROS (+)   Anesthesia Other Findings Past Medical History:   Need for Tdap vaccination                                      Comment:[redacted] weeks   Gestational diabetes                                         Headache                                                    Past Surgical History:   CESAREAN SECTION                                 2015         ANKLE FRACTURE SURGERY                          Left              FRACTURE SURGERY                                                Reproductive/Obstetrics negative OB ROS (+) Pregnancy                             Anesthesia Physical Anesthesia Plan  ASA: III  Anesthesia Plan: Spinal   Post-op Pain Management:    Induction:   Airway Management Planned:   Additional Equipment:   Intra-op Plan:   Post-operative Plan:   Informed Consent: I have reviewed the patients History and Physical, chart, labs and discussed the procedure including the risks, benefits and alternatives for the proposed anesthesia  with the patient or authorized representative who has indicated his/her understanding and acceptance.   Dental Advisory Given  Plan Discussed with: Anesthesiologist, CRNA and Surgeon  Anesthesia Plan Comments:         Anesthesia Quick Evaluation

## 2015-05-10 NOTE — H&P (Signed)
Obstetric Preoperative History and Physical  Stacy Best is a 35 y.o. [phone] with IUP at [redacted]w[redacted]d presenting for presenting for scheduled repeat cesarean section for uncontrolled gestation diabetes (currently on Glyburide).  No acute concerns.   Prenatal Course Source of Care: [facility]  with onset of care at 12 weeks Pregnancy complications or risks: Patient Active Problem List   Diagnosis Date Noted  . Preterm labor in third trimester without delivery 04/17/2015  . Medically noncompliant 04/12/2015  . Gestational diabetes mellitus 01/23/2015  . Obesity in pregnancy 11/17/2014  . Advanced maternal age in multigravida 11/17/2014  . H/O cesarean section complicating pregnancy 11/17/2014  . H/O miscarriage, currently pregnant 11/17/2014  . Normal pregnancy, incidental 05/02/2013   She plans to breastfeed She desires interval bilateral tubal ligation for postpartum contraception.   Prenatal labs and studies: ABO, Rh: --/--/O POS (01/25 1344) Antibody: NEG (01/25 1342) Rubella: 2.67 (06/24 1410) RPR: Non Reactive (01/25 1342)  HBsAg: Negative (06/24 1410)  HIV: Non Reactive (06/24 1410)  GBS: Negative (01/03 0300) 1 hr Glucola  abnormal, 3hr GTT abnormal (2 values) Genetic screening normal Anatomy US normal  Prenatal Transfer Tool  Maternal Diabetes: Yes:  Diabetes Type:  Insulin/Medication controlled Genetic Screening: Normal Maternal Ultrasounds/Referrals: Normal Fetal Ultrasounds or other Referrals:  Referred to Materal Fetal Medicine  Maternal Substance Abuse:  No Significant Maternal Medications:  Meds include: Other: Glyburide Significant Maternal Lab Results: Lab values include: Group B Strep negative  Past Medical History  Diagnosis Date  . Need for Tdap vaccination     28 weeks  . Gestational diabetes   . Headache     Past Surgical History  Procedure Laterality Date  . Cesarean section  2015  . Ankle fracture surgery Left   . Fracture surgery       OB History  Gravida Para Term Preterm AB SAB TAB Ectopic Multiple Living  # Outcome Date GA Lbr Len/2nd Weight Sex Delivery Anes PTL Lv  4 Current           3 Term 2015 [redacted]w[redacted]d  9 lb (4.082 kg) F CS-Unspec EPI  Y     Complications: Chorioamnionitis,Tachycardia  2 TAB 2003          1 SAB 2000            Obstetric Comments  Patient dilated to 9 cm prior to C-section for NRFHT (persistent tachycardia due to chorioamnionitis); prolonged labor    Social History   Social History  . Marital Status: Single    Spouse Name: N/A  . Number of Children: N/A  . Years of Education: N/A   Social History Main Topics  . Smoking status: Never Smoker   . Smokeless tobacco: Never Used  . Alcohol Use: No     Comment: 2 per month prior to pregnancy  . Drug Use: No  . Sexual Activity: Yes   Other Topics Concern  . Not on file   Social History Narrative    Family History  Problem Relation Age of Onset  . Breast cancer Mother 44  . Diabetes Father   . Ovarian cancer Neg Hx   . Colon cancer Neg Hx   . Heart disease Neg Hx     Prescriptions prior to admission  Medication Sig Dispense Refill Last Dose  . ACCU-CHEK FASTCLIX LANCETS MISC 1 Device by Percutaneous route 4 (four) times daily. 100 each 12 Taking  .  acetaminophen (TYLENOL) 500 MG tablet Take 500 mg by mouth every 6 (six) hours as needed.   Taking  . glucose blood test strip Use as instructed 100 each 12 Taking  . glyBURIDE (DIABETA) 5 MG tablet Take 1 tablet (5 mg total) by mouth 2 (two) times daily with a meal. (Patient taking differently: Take 10 mg by mouth 2 (two) times daily with a meal. ) 60 tablet 3 Taking  . Prenatal MV-Min-Fe Fum-FA-DHA (PRENATAL MULTIVITAMIN PLUS DHA) 27-0.8-250 MG CAPS Take 1 tablet by mouth daily. 60 capsule 4 Taking  . terconazole (TERAZOL 3) 0.8 % vaginal cream Place 1 applicator vaginally at bedtime. For 7 days 20 g 0 Taking    No Known Allergies  Review of Systems:  Negative except for what is mentioned in HPI.  Physical Exam: LMP 08/20/2014 (Approximate) FHR by Doppler: 130s bpm GENERAL: Well-developed,obese female in no acute distress.  LUNGS: Clear to auscultation bilaterally.  HEART: Regular rate and rhythm. ABDOMEN: Soft, nontender, nondistended, gravid, well-healed Pfannenstiel incision.  Pitting edema of pannus. PELVIC: 2.5/50/ballotable/breech EXTREMITIES: Nontender, no edema, 2+ distal pulses.   Pertinent Labs/Studies:   Results for orders placed or performed during the hospital encounter of 05/09/15 (from the past 72 hour(s))  CBC     Status: Abnormal   Collection Time: 05/09/15  1:42 PM  Result Value Ref Range   WBC 6.2 3.6 - 11.0 K/uL   RBC 4.15 3.80 - 5.20 MIL/uL   Hemoglobin 12.1 12.0 - 16.0 g/dL   HCT 16.1 09.6 - 04.5 %   MCV 87.7 80.0 - 100.0 fL   MCH 29.3 26.0 - 34.0 pg   MCHC 33.4 32.0 - 36.0 g/dL   RDW 40.9 (H) 81.1 - 91.4 %   Platelets 147 (L) 150 - 440 K/uL  RPR     Status: None   Collection Time: 05/09/15  1:42 PM  Result Value Ref Range   RPR Ser Ql Non Reactive Non Reactive    Comment: (NOTE) Performed At: [facility] [address] [place], [place] [id number] Mila Homer MD YQ:[id number]   Type and screen     Status: None   Collection Time: 05/09/15  1:42 PM  Result Value Ref Range   ABO/RH(D) O POS    Antibody Screen NEG    Sample Expiration 05/12/2015    Extend sample reason PREGNANT WITHIN 3 MONTHS, UNABLE TO EXTEND   ABO/Rh     Status: None   Collection Time: 05/09/15  1:44 PM  Result Value Ref Range   ABO/RH(D) O POS     Assessment and Plan :Stacy Best is a 35 y.o. [id number] at [redacted]w[redacted]d being admitted  for scheduled cesarean section delivery . The patient is understanding of the planned procedure and is aware of and accepting of all surgical risks, including but not limited to: bleeding which may require transfusion or reoperation; infection which may require antibiotics; injury to  bowel, bladder, ureters or other surrounding organs which may require repair; injury to the fetus; need for additional procedures including hysterectomy in the event of life-threatening complications; placental abnormalities wth subsequent pregnancies; incisional problems; blood clot disorders which may require blood thinners;, and other postoperative/anesthesia complications. The patient is in agreement with the proposed plan, and gives informed written consent for the procedure. All questions have been answered. Gestational diabetes (poorly controlled).  Will continue to monitor blood sugars postpartum.   Hildred Laser, MD [facility]

## 2015-05-10 NOTE — Op Note (Signed)
Cesarean Section Procedure Note  Indications: previous uterine incision low transverse, suspected malpresentation, poorly controlled gestational diabetes A2   Pre-operative Diagnosis: 37 week 4 day pregnancy, h/o Cesarean section x 1, obesity, Gestational Diabetes A2 (poorly controlled), suspected fetal malpresentation.  Post-operative Diagnosis: Same, except vertex presentation, fetal macrosomia  Surgeon: Hildred Laser, MD  Assistants: Sharon Seller, MD  Procedure: Repeat low transverse Cesarean Section  Anesthesia: Spinal anesthesia  ASA Class: III  Findings: Female/Female infant, cephalic presentation, 4536 grams, with Apgar scores of 8 at one minute and 9 at five minutes. Intact placenta with 3 vessel cord.  The uterine outline, tubes and ovaries appeared normal.   Procedure Details: The patient was seen in the Holding Room. The risks, benefits, complications, treatment options, and expected outcomes were discussed with the patient.  The patient concurred with the proposed plan, giving informed consent.  The site of surgery properly noted/marked. The patient was taken to the Operating Room, identified as Stacy Best and the procedure verified as C-Section Delivery. A Time Out was held and the above information confirmed.  After induction of anesthesia, the patient was draped and prepped in the usual sterile manner. Anesthesia was tested and noted to be adequate. A Pfannenstiel incision was made and carried down through the subcutaneous tissue to the fascia. Fascial incision was made and extended transversely. The fascia was separated from the underlying rectus tissue superiorly and inferiorly. The peritoneum was identified and entered. Peritoneal incision was extended longitudinally. The utero-vesical peritoneal reflection was incised transversely and the bladder flap was bluntly freed from the lower uterine segment. A low transverse uterine incision was made. Delivered from  cephalic presentation was a 4536 gram Female with Apgar scores of 8 at one minute and 9 at five minutes. After the umbilical cord was clamped and cut, cord blood was obtained for evaluation. The placenta was removed intact and appeared normal. The uterus was exteriorized and cleared of all clots and debris. The uterine outline, tubes and ovaries appeared normal.  The uterine incision was closed with running locked sutures of 0-Vicryl.  A second suture of 0-Vicryl was used in an imbricating layer.  A figure of eight suture was placed at the left lateral edge of the uterine incision due to continued bleeding.  Hemostasis was observed. The uterus was returned to the abdomen. Lavage was carried out until clear. The fascia was then reapproximated with a running suture of 0 Vicryl. The subcutaneous fat layer was reapproximated with 3-0 Vicryl. The skin was reapproximated with staples (absorbable).  Instrument, sponge, and needle counts were correct prior the abdominal closure and at the conclusion of the case.   Estimated Blood Loss:  750 ml      Drains: foley catheter to gravity drainage, 120 ml clear urine at end of the procedure         Total IV Fluids:  800 ml  Specimens: Placenta and Disposition:  Sent to Pathology         Implants: None         Complications:  None; patient tolerated the procedure well.         Disposition: PACU - hemodynamically stable.         Condition: stable  Hildred Laser, MD [facility]

## 2015-05-11 ENCOUNTER — Encounter: Payer: Self-pay | Admitting: Obstetrics and Gynecology

## 2015-05-11 DIAGNOSIS — O3660X0 Maternal care for excessive fetal growth, unspecified trimester, not applicable or unspecified: Secondary | ICD-10-CM

## 2015-05-11 DIAGNOSIS — O99119 Other diseases of the blood and blood-forming organs and certain disorders involving the immune mechanism complicating pregnancy, unspecified trimester: Secondary | ICD-10-CM

## 2015-05-11 DIAGNOSIS — D696 Thrombocytopenia, unspecified: Secondary | ICD-10-CM

## 2015-05-11 LAB — CBC
HEMATOCRIT: 31 % — AB (ref 35.0–47.0)
HEMOGLOBIN: 10.6 g/dL — AB (ref 12.0–16.0)
MCH: 30.1 pg (ref 26.0–34.0)
MCHC: 34.1 g/dL (ref 32.0–36.0)
MCV: 88.3 fL (ref 80.0–100.0)
Platelets: 127 10*3/uL — ABNORMAL LOW (ref 150–440)
RBC: 3.5 MIL/uL — AB (ref 3.80–5.20)
RDW: 15.5 % — ABNORMAL HIGH (ref 11.5–14.5)
WBC: 8.9 10*3/uL (ref 3.6–11.0)

## 2015-05-11 LAB — SURGICAL PATHOLOGY

## 2015-05-11 LAB — GLUCOSE, CAPILLARY: GLUCOSE-CAPILLARY: 70 mg/dL (ref 65–99)

## 2015-05-11 NOTE — Anesthesia Postprocedure Evaluation (Signed)
Anesthesia Post Note  Patient: Stacy Best  Procedure(s) Performed: Procedure(s) (LRB): REPEAT C SECTION (N/A)  Patient location during evaluation: Mother Baby Anesthesia Type: Spinal Level of consciousness: awake and alert Pain management: pain level controlled Vital Signs Assessment: post-procedure vital signs reviewed and stable Respiratory status: spontaneous breathing, nonlabored ventilation and respiratory function stable Cardiovascular status: stable Postop Assessment: no headache, no backache and epidural receding Anesthetic complications: no    Last Vitals:  Filed Vitals:   05/11/15 0509 05/11/15 0904  BP: 96/52 108/63  Pulse: 71 73  Temp: 37 C 36.7 C  Resp:  18    Last Pain:  Filed Vitals:   05/11/15 1043  PainSc: 7                  Jomarie Longs K Piscitello

## 2015-05-11 NOTE — Progress Notes (Addendum)
Postpartum Day 1: Cesarean Delivery  Subjective: Patient reports tolerating PO, + flatus and no problems voiding.    Objective: Vital signs in last 24 hours: Temp:  [97.5 F (36.4 C)-98.6 F (37 C)] 98 F (36.7 C) (01/27 0904) Pulse Rate:  [63-98] 73 (01/27 0904) Resp:  [17-20] 18 (01/27 0904) BP: (96-109)/(51-66) 108/63 mmHg (01/27 0904) SpO2:  [94 %-100 %] 97 % (01/27 0904)  Physical Exam:  General: alert and no distress  Respiratory: clear to auscultation bilaterally Cardiovascular: S1 and S2 present, regular rate and rhythm Lochia: appropriate Uterine Fundus: firm Incision: healing well, no significant drainage, no dehiscence, no significant erythema DVT Evaluation: No evidence of DVT seen on physical exam. Negative Homan's sign. No cords or calf tenderness. No significant calf/ankle edema.   Recent Labs CBC Latest Ref Rng 05/11/2015 05/09/2015  WBC 3.6 - 11.0 K/uL 8.9 6.2  Hemoglobin 12.0 - 16.0 g/dL 10.6(L) 12.1  Hematocrit 35.0 - 47.0 % 31.0(L) 36.4  Platelets 150 - 440 K/uL 127(L) 147(L)     Accuchecks 60s-90s  Assessment/Plan: Status post Cesarean section. Doing well postoperatively.  Continue current care. Gestational Diabetes A2 - Discontinue Accuchecks.  Patient well controlled postpartum.  No need for medications.  Anemia due to surgical blood loss - asymptomatic.  Anticipated normal blood loss.  Will treat with PO iron daily. Thrombocytopenia - mild, likely gestational Breastfeeding. Desires interval BTL for contraception.  Hildred Laser 05/11/2015, 9:12 AM

## 2015-05-11 NOTE — Progress Notes (Signed)
Initial Nutrition Assessment      INTERVENTION:  Meals and snacks: cater to pt preferences Nutrition diet education: Discussed carb modified diet blood glucose control once discharged.  Pt verbalized understanding and expect good compliance.   Coordination of care: No further intervention needed at this time. Please re-consult RD if further nutrition concerns.   NUTRITION DIAGNOSIS:   Food and nutrition related knowledge deficit related to  (current condition) as evidenced by  (consult from MD).    GOAL:   Patient will meet greater than or equal to 90% of their needs    MONITOR:    (Energy intake, Glucose profile)  REASON FOR ASSESSMENT:   Consult Diet education  ASSESSMENT:      Pt s/p c-section, gestational DM  Past Medical History  Diagnosis Date  . Need for Tdap vaccination     28 weeks  . Gestational diabetes   . Headache     Current Nutrition: tolerating carb modified diet  Food/Nutrition-Related History: good appetite prior to admission   Scheduled Medications:  . ferrous sulfate  325 mg Oral BID WC  . ibuprofen  600 mg Oral 4 times per day  . prenatal multivitamin  1 tablet Oral Q1200  . senna-docusate  2 tablet Oral Q24H  . simethicone  80 mg Oral Q24H  . Tdap  0.5 mL Intramuscular Once    Continuous Medications:  . lactated ringers Stopped (05/11/15 1215)     Gastrointestinal Profile: Last BM: WDL    Diet Order:  Diet Carb Modified Fluid consistency:: Thin; Room service appropriate?: Yes  Skin:   reviewed      Height:   Ht Readings from Last 1 Encounters:  05/09/15 5' (1.524 m)    Weight:   Wt Readings from Last 1 Encounters:  05/09/15 187 lb (84.823 kg)    Ideal Body Weight:     BMI:  There is no weight on file to calculate BMI.   EDUCATION NEEDS:   Education needs addressed  Fatema Rabe B. Freida Busman, RD, LDN [phone] (pager) Weekend/On-Call pager [phone])

## 2015-05-11 NOTE — Anesthesia Post-op Follow-up Note (Signed)
  Anesthesia Pain Follow-up Note  Patient: Stacy Best  Day #: 1  Date of Follow-up: 05/11/2015 Time: 11:40 AM  Last Vitals:  Filed Vitals:   05/11/15 0509 05/11/15 0904  BP: 96/52 108/63  Pulse: 71 73  Temp: 37 C 36.7 C  Resp:  18    Level of Consciousness: alert  Pain: moderate   Side Effects:None  Catheter Site Exam:clean, dry, no drainage  Plan: D/C from anesthesia care  Cleda Mccreedy Rucker [place]

## 2015-05-12 MED ORDER — DOCUSATE SODIUM 100 MG PO CAPS: 100.0000 mg | ORAL_CAPSULE | Freq: Two times a day (BID) | ORAL | Status: DC | PRN

## 2015-05-12 MED ORDER — IBUPROFEN 800 MG PO TABS: 800.0000 mg | ORAL_TABLET | Freq: Three times a day (TID) | ORAL | Status: DC | PRN

## 2015-05-12 MED ORDER — OXYCODONE-ACETAMINOPHEN 5-325 MG PO TABS: 1.0000 | ORAL_TABLET | Freq: Four times a day (QID) | ORAL | Status: DC | PRN

## 2015-05-12 MED ORDER — LIDOCAINE 5 % EX PTCH
1.0000 | MEDICATED_PATCH | CUTANEOUS | Status: DC
  Administered 2015-05-12: 1 via TRANSDERMAL

## 2015-05-12 MED ORDER — PRENATAL VITAMIN PLUS LOW IRON 27-1 MG PO TABS: 1.0000 | ORAL_TABLET | Freq: Every day | ORAL | Status: DC

## 2015-05-12 NOTE — Discharge Instructions (Signed)
Cesarean Delivery, Care After Refer to this sheet in the next few weeks. These instructions provide you with information on caring for yourself after your procedure. Your health care provider may also give you specific instructions. Your treatment has been planned according to current medical practices, but problems sometimes occur. Call your health care provider if you have any problems or questions after you go home. HOME CARE INSTRUCTIONS  Only take over-the-counter or prescription medications as directed by your health care provider.  Do not drink alcohol, especially if you are breastfeeding or taking medication to relieve pain.  Do not chew or smoke tobacco.  Continue to use good perineal care. Good perineal care includes:  Wiping your perineum from front to back.  Keeping your perineum clean.  Check your surgical cut (incision) daily for increased redness, drainage, swelling, or separation of skin.  Clean your incision gently with soap and water every day, and then pat it dry. If your health care provider says it is okay, leave the incision uncovered. Use a bandage (dressing) if the incision is draining fluid or appears irritated. Hug a pillow when coughing or sneezing until your incision is healed. This helps to relieve pain.  Do not use tampons or douche until your health care provider says it is okay.  Shower, wash your hair, and take tub baths as directed by your health care provider.  Wear a well-fitting bra that provides breast support.  Limit wearing support panties or control-top hose.  Drink enough fluids to keep your urine clear or pale yellow.  Eat high-fiber foods such as whole grain cereals and breads, brown rice, beans, and fresh fruits and vegetables every day. These foods may help prevent or relieve constipation.  Resume activities such as climbing stairs, driving, lifting, exercising, or traveling as directed by your health care provider.  Talk to your health  care provider about resuming sexual activities. This is dependent upon your risk of infection, your rate of healing, and your comfort and desire to resume sexual activity.  Try to have someone help you with your household activities and your newborn for at least a few days after you leave the hospital.  Rest as much as possible. Try to rest or take a nap when your newborn is sleeping.  Increase your activities gradually.  Keep all of your scheduled postpartum appointments. It is very important to keep your scheduled follow-up appointments. At these appointments, your health care provider will be checking to make sure that you are healing physically and emotionally. SEEK MEDICAL CARE IF:   You are passing large clots from your vagina. Save any clots to show your health care provider.  You have a foul smelling discharge from your vagina.  You have trouble urinating.  You are urinating frequently.  You have pain when you urinate.  You have a change in your bowel movements.  You have increasing redness, pain, or swelling near your incision.  You have pus draining from your incision.  Your incision is separating.  You have painful, hard, or reddened breasts.  You have a severe headache.  You have blurred vision or see spots.  You feel sad or depressed.  You have thoughts of hurting yourself or your newborn.  You have questions about your care, the care of your newborn, or medications.  You are dizzy or light-headed.  You have a rash.  You have pain, redness, or swelling at the site of the removed intravenous access (IV) tube.  You have nausea  or vomiting.  You stopped breastfeeding and have not had a menstrual period within 12 weeks of stopping.  You are not breastfeeding and have not had a menstrual period within 12 weeks of delivery.  You have a fever. SEEK IMMEDIATE MEDICAL CARE IF:  You have persistent pain.  You have chest pain.  You have shortness of  breath.  You faint.  You have leg pain.  You have stomach pain.  Your vaginal bleeding saturates 2 or more sanitary pads in 1 hour. MAKE SURE YOU:   Understand these instructions.  Will watch your condition.  Will get help right away if you are not doing well or get worse.   This information is not intended to replace advice given to you by your health care provider. Make sure you discuss any questions you have with your health care provider.   Document Released: 12/21/2001 Document Revised: 04/21/2014 Document Reviewed: 11/26/2011 Elsevier Interactive Patient Education 2016 [employer] Incision Care: Keep incision area clean, dry and open to air. Shower daily to prevent infection. Only pat incisions, no rubbing or circling the incision area. Use mild soap and warm water to clean incisions. Make sure to dry area completely.  Monitor incision area for redness, severe pain, drainage, or odor, if so contact your physician.  No heavy lifting until cleared by physician.  Take pain medications to manage pain, if pain is increased or unrelieved by medications contact your physician.   Make sure to stay active, ambulate often.  Call your physician if you develop a temperature greater than 100.4, experience any chest pain, shortness of breath.  Make sure to follow up with physician within specified time.  Bleeding: Your bleeding could continue up to 6 weeks, the flow should gradually decrease and the color should become dark then lightened over the next couple of weeks. If you notice you are bleeding heavily or passing clots larger than the size of your fist, PLEASE call your physician. No TAMPONS, DOUCHING, ENEMAS OR SEXUAL INTERCOURSE for 6 weeks.   Stitches: Shower daily with mild soap and water. Stitches will dissolve over the next couple of weeks, if you experience any discomfort in the vaginal area you may sit in warm water 15-20 minutes, 3-4 times per day. Just enough water to  cover vaginal area.   AfterPains: This is the uterus contracting back to its normal position and size. Use medications prescribed or recommended by your physician to help relieve this discomfort.   Bowels/Hemorrhoids: Drink plenty of water and stay active. Increase fiber, fresh fruits and vegetables in your diet.   Rest/Activity: Rest when the baby is resting  Bathing: Shower daily!  Diet: Continue to eat extra calories until your follow up visit to help replenish nutrients and vitamins. If breastfeeding eat an extra [phone] and increase your fluid intake to 12 glasses a day.   Contraception: Consult with your physician on what method of birth control you would like to use.   Postpartum "BLUES": It is common to emotional days after delivery, however if it persist for greater than 2 weeks or if you feel concerned please let your physician know immediately. This is hormone driven and nothing you can control so please let someone know how you feel.  Follow Up Visit: Please schedule a follow up visit with your physician.

## 2015-05-12 NOTE — Discharge Summary (Signed)
Obstetric Discharge Summary Reason for Admission: cesarean section and uncontrolled gestational diabetes (Class A2) Prenatal Procedures: NST and ultrasound Intrapartum Procedures: cesarean: low cervical, transverse Postpartum Procedures: none Complications-Operative and Postpartum: none HEMOGLOBIN  Date Value Ref Range Status  05/11/2015 10.6* 12.0 - 16.0 g/dL Final   HCT  Date Value Ref Range Status  05/11/2015 31.0* 35.0 - 47.0 % Final   HEMATOCRIT  Date Value Ref Range Status  03/19/2015 36.2 34.0 - 46.6 % Final    Physical Exam:  General: alert and no distress Lochia: appropriate Uterine Fundus: firm Incision: healing well, no significant drainage, no dehiscence, no significant erythema DVT Evaluation: No evidence of DVT seen on physical exam. Negative Homan's sign. No cords or calf tenderness. No significant calf/ankle edema.  Discharge Diagnoses: Term Pregnancy-delivered and Gestational Diabetes (Class A2)  Discharge Information: Date: 05/12/2015 Activity: pelvic rest Diet: routine Medications: PNV, Ibuprofen, Colace and Percocet Condition: stable Instructions: refer to practice specific booklet Discharge to: home   Newborn Data: Live born female  Birth Weight: 10 lb (4536 g) APGAR: 8, 9  Home with mother.  Hildred Laser 05/12/2015, 10:18 AM

## 2015-05-12 NOTE — Progress Notes (Signed)
Patient understands all discharge instructions and the need to make follow up appointments. Patient discharge via wheelchair with auxillary. 

## 2015-05-12 NOTE — Progress Notes (Signed)
Postpartum Day 2: Cesarean Delivery   Subjective: Patient reports tolerating PO, + flatus, + BM and no problems voiding.  Pain well controlled.  Objective: Vital signs in last 24 hours: Temp:  [98 F (36.7 C)-98.7 F (37.1 C)] 98.6 F (37 C) (01/28 0805) Pulse Rate:  [73-76] 73 (01/28 0805) Resp:  [18] 18 (01/28 0805) BP: (102-103)/(50-71) 103/50 mmHg (01/28 0805) SpO2:  [99 %] 99 % (01/28 0805)  Physical Exam:  General: alert and no distress  Respiratory: clear to auscultation bilaterally Cardiovascular: S1 and S2 present, regular rate and rhythm Lochia: appropriate Uterine Fundus: firm Incision: healing well, no significant drainage, no dehiscence, no significant erythema DVT Evaluation: No evidence of DVT seen on physical exam. Negative Homan's sign. No cords or calf tenderness. No significant calf/ankle edema.   Recent Labs CBC Latest Ref Rng 05/11/2015 05/09/2015  WBC 3.6 - 11.0 K/uL 8.9 6.2  Hemoglobin 12.0 - 16.0 g/dL 10.6(L) 12.1  Hematocrit 35.0 - 47.0 % 31.0(L) 36.4  Platelets 150 - 440 K/uL 127(L) 147(L)     Assessment/Plan: Status post Cesarean section. Doing well postoperatively.  Continue current care. Gestational Diabetes A2 - S/p nutritional consult.  Will need 2 hr GTT postpartum.  Anemia due to surgical blood loss - asymptomatic.  Anticipated normal blood loss.  Will treat with PO iron daily. Thrombocytopenia - mild, likely gestational Breastfeeding. Desires interval BTL for contraception. Will discharge home today.  Hildred Laser 05/12/2015, 10:17 AM

## 2015-05-17 ENCOUNTER — Encounter: Payer: Self-pay | Admitting: Obstetrics and Gynecology

## 2015-05-17 ENCOUNTER — Encounter: Payer: Medicaid Other | Admitting: Obstetrics and Gynecology

## 2015-05-17 ENCOUNTER — Ambulatory Visit (INDEPENDENT_AMBULATORY_CARE_PROVIDER_SITE_OTHER): Payer: Medicaid Other | Admitting: Obstetrics and Gynecology

## 2015-05-17 VITALS — BP 98/64 | HR 67 | Ht 60.0 in | Wt 166.3 lb

## 2015-05-17 DIAGNOSIS — Z9889 Other specified postprocedural states: Secondary | ICD-10-CM

## 2015-05-17 DIAGNOSIS — Z98891 History of uterine scar from previous surgery: Secondary | ICD-10-CM

## 2015-05-17 DIAGNOSIS — O24419 Gestational diabetes mellitus in pregnancy, unspecified control: Secondary | ICD-10-CM

## 2015-05-17 MED ORDER — OXYCODONE-ACETAMINOPHEN 5-325 MG PO TABS: 1.0000 | ORAL_TABLET | Freq: Four times a day (QID) | ORAL | Status: DC | PRN

## 2015-05-17 NOTE — Progress Notes (Signed)
   OBSTETRIC POST-OPERATIVE CLINIC VISIT  Subjective:     Stacy Best is a 35 y.o. [phone] female who presents to the clinic 1 weeks status post repeat C-section  at 37 weeks for h/o prior C-section x 1. Pregnancy complicated by uncontrolled gestational diabetes. Eating a regular diet without difficulty. Bowel movements are normal. Pain is controlled with current analgesics. Medications being used: prescription NSAID's including Ibuprofen and narcotic analgesics including oxycodone/acetaminophen (Percocet, Tylox).  The following portions of the patient's history were reviewed and updated as appropriate: allergies, current medications, past family history, past medical history, past social history, past surgical history and problem list.  Review of Systems A comprehensive review of systems was negative except for: right sided incisional pain    Objective:    BP 98/64 mmHg  Pulse 67  Ht 5' (1.524 m)  Wt 166 lb 4.8 oz (75.433 kg)  BMI 32.48 kg/m2  Breastfeeding? Yes General:  alert and no distress  Abdomen: soft, bowel sounds active, non-tender  Incision:   healing well, no drainage, no erythema, no hernia, no seroma, no swelling, no dehiscence, incision well approximated     Assessment:    Doing well postoperatively.  S/p repeat C-section Gestational diabetes (uncontrolled)  Plan:    1. Continue any current medications. Refill given on Percocet for pain at patient's request 2. Wound care discussed.  Steri-strips placed over incision.  3. Activity restrictions: no bending, stooping, or squatting and no lifting more than 15 pounds 4. Anticipated return to work: not applicable. 5. Follow up: 5 weeks for postpartum visit. Will need 2 hr postpartum glucola at that time.    Hildred Laser, MD [facility]

## 2015-05-22 ENCOUNTER — Telehealth: Payer: Self-pay | Admitting: Obstetrics and Gynecology

## 2015-05-22 NOTE — Telephone Encounter (Signed)
Pt called and she has a UTI and wanted to speak with you

## 2015-05-22 NOTE — Telephone Encounter (Signed)
Called pt, no answer. LM for pt to call back and schedule an appointment for UTI symptoms if she indeed thinks she has one.

## 2015-05-23 ENCOUNTER — Other Ambulatory Visit (INDEPENDENT_AMBULATORY_CARE_PROVIDER_SITE_OTHER): Payer: Medicaid Other | Admitting: Obstetrics and Gynecology

## 2015-05-23 DIAGNOSIS — R300 Dysuria: Secondary | ICD-10-CM

## 2015-05-23 LAB — POCT URINALYSIS DIPSTICK
BILIRUBIN UA: NEGATIVE
GLUCOSE UA: NEGATIVE
Ketones, UA: NEGATIVE
Nitrite, UA: NEGATIVE
Protein, UA: NEGATIVE
SPEC GRAV UA: 1.01
UROBILINOGEN UA: NEGATIVE
pH, UA: 7.5

## 2015-05-24 LAB — URINE CULTURE

## 2015-05-25 ENCOUNTER — Telehealth: Payer: Self-pay

## 2015-05-25 NOTE — Progress Notes (Signed)
Quick Note:  Pt informed ______ 

## 2015-05-25 NOTE — Telephone Encounter (Signed)
-----   Message from Hildred Laser, MD sent at 05/25/2015 11:43 AM EST ----- Can inform patient that urine culture was negative.  No UTI.

## 2015-05-25 NOTE — Telephone Encounter (Signed)
Called pt LM informing pt of negative urine culture.

## 2015-05-29 ENCOUNTER — Telehealth: Payer: Self-pay | Admitting: Obstetrics and Gynecology

## 2015-05-29 DIAGNOSIS — R399 Unspecified symptoms and signs involving the genitourinary system: Secondary | ICD-10-CM

## 2015-05-29 NOTE — Telephone Encounter (Signed)
Called pt she states that she is still having UTI symptoms (buringing with urination, and bleeding). Pt had negative urine culture one week ago. Please advise.

## 2015-05-29 NOTE — Telephone Encounter (Signed)
Please inform patient that if she continues to have symptoms, we can treat her symptomatically for UTI.  She can take Macrobid 100 mg BID. Also encourage adequate hydration and can use AZo for symptoms.

## 2015-05-29 NOTE — Telephone Encounter (Signed)
Patient called stating she is experiencing burning with urination and bleeding and wanted to speak with you .Thanks

## 2015-05-30 MED ORDER — NITROFURANTOIN MONOHYD MACRO 100 MG PO CAPS: 100.0000 mg | ORAL_CAPSULE | Freq: Two times a day (BID) | ORAL | Status: DC

## 2015-05-30 NOTE — Telephone Encounter (Signed)
LM for pt informing her that we are going to treat her symptoms with Macrobid 100 BID. Advised pt to push fluids and use AZO.

## 2015-06-06 ENCOUNTER — Telehealth: Payer: Self-pay | Admitting: Obstetrics and Gynecology

## 2015-06-06 NOTE — Telephone Encounter (Signed)
Stacy Best had a c-ection. She has a rash where her incision is. Not blisters, ust red, can you give her a call.

## 2015-06-06 NOTE — Telephone Encounter (Signed)
Notes redness of incision x 2 days, but denies tenderness, fevers/chills, blisters, or drainage of incision. Patient does note that she was not showering appropriately initially after leaving the hospital (i.e. Not allowing soap or water to touch the incision) however is doing better now.  Also reports that she does wear pants that rub against the incision and having excessive sweating in the area due to her pannus.  Advised on use of Neosporin on incision site daily for several days, keeping incision site dry, and can appropriate wound care . If no improvement in redness or other symptoms develop, to f/u in office  ASAP for wound check.

## 2015-06-06 NOTE — Telephone Encounter (Signed)
Hey see message

## 2015-06-08 ENCOUNTER — Telehealth: Payer: Self-pay | Admitting: Obstetrics and Gynecology

## 2015-06-08 ENCOUNTER — Encounter: Payer: Self-pay | Admitting: Obstetrics and Gynecology

## 2015-06-08 MED ORDER — CEPHALEXIN 500 MG PO CAPS: 500.0000 mg | ORAL_CAPSULE | Freq: Two times a day (BID) | ORAL | Status: DC

## 2015-06-08 NOTE — Telephone Encounter (Signed)
I spoke with pt and she denies any fever but drainage is darkish yellow, with small amt blood, has odor and odor is very strong, area is red and tender. Pt aware that I will contact Dr. Valentino Saxon and let her know our plans from here.

## 2015-06-08 NOTE — Telephone Encounter (Signed)
Pt aware Dr Valentino Saxon wishes for her to take Keflex  bid x7days. Pt would like this sent to [facility] in [place]. Had also inquired if this was safe to take during breast feeding?  I checked with Dr. Valentino Saxon and it is okay. Left message to this effect regarding the med and breast feeding.

## 2015-06-08 NOTE — Telephone Encounter (Signed)
PT CALLED AND SHE SPOKE TO DR CHERRY A FEW DAYS AGO AND DR CHERRY TOLD HER THAT IF HER INCISION DID NOT GET BETTER IN THE NEXT COUPLE OF DAYS TO COME IN AND BE SEEN, OF COURSE DR CHERRY IS NOT HERE TODAY AND OKI WANT BE IN UNTIL NOON, NOT SURE IF SHE NEEDS TO BE SEEN TODAY OR COME IN NEXT WEEKS AND BE SEEN. SHE DIDN'T TELL ME ANYTHING ABOUT WHAT HER INCISION LOOKED LIKE JUST  THAT IT WASN'T BETTER.

## 2015-06-11 ENCOUNTER — Encounter: Payer: Self-pay | Admitting: *Deleted

## 2015-06-11 ENCOUNTER — Emergency Department
Admission: EM | Admit: 2015-06-11 | Discharge: 2015-06-11 | Disposition: A | Discharge status: Home / Self Care | Payer: Medicaid Other | Attending: Emergency Medicine | Admitting: Emergency Medicine

## 2015-06-11 DIAGNOSIS — T7840XA Allergy, unspecified, initial encounter: Secondary | ICD-10-CM | POA: Diagnosis present

## 2015-06-11 DIAGNOSIS — L500 Allergic urticaria: Secondary | ICD-10-CM | POA: Diagnosis not present

## 2015-06-11 DIAGNOSIS — L509 Urticaria, unspecified: Secondary | ICD-10-CM

## 2015-06-11 DIAGNOSIS — Z792 Long term (current) use of antibiotics: Secondary | ICD-10-CM | POA: Diagnosis not present

## 2015-06-11 DIAGNOSIS — T361X5A Adverse effect of cephalosporins and other beta-lactam antibiotics, initial encounter: Secondary | ICD-10-CM | POA: Diagnosis not present

## 2015-06-11 DIAGNOSIS — Z79899 Other long term (current) drug therapy: Secondary | ICD-10-CM | POA: Diagnosis not present

## 2015-06-11 MED ORDER — DIPHENHYDRAMINE HCL 25 MG PO CAPS
25.0000 mg | ORAL_CAPSULE | Freq: Once | ORAL | Status: AC
  Administered 2015-06-11: 25 mg via ORAL

## 2015-06-11 MED ORDER — FAMOTIDINE 20 MG PO TABS
40.0000 mg | ORAL_TABLET | Freq: Once | ORAL | Status: AC
  Administered 2015-06-11: 40 mg via ORAL
  Filled 2015-06-11: qty 2

## 2015-06-11 MED ORDER — PREDNISONE 20 MG PO TABS: 60.0000 mg | ORAL_TABLET | Freq: Every day | ORAL | Status: DC

## 2015-06-11 MED ORDER — PREDNISONE 20 MG PO TABS
ORAL_TABLET | ORAL | Status: AC
  Filled 2015-06-11: qty 2

## 2015-06-11 MED ORDER — DIPHENHYDRAMINE HCL 25 MG PO CAPS
ORAL_CAPSULE | ORAL | Status: AC
  Filled 2015-06-11: qty 1

## 2015-06-11 MED ORDER — PREDNISONE 20 MG PO TABS
60.0000 mg | ORAL_TABLET | Freq: Once | ORAL | Status: AC
  Administered 2015-06-11: 60 mg via ORAL
  Filled 2015-06-11: qty 3

## 2015-06-11 NOTE — ED Notes (Signed)
Pt reports rash beginning yesterday afternoon after starting Keflex for post-op wound infection.  Pt started keflex yesterday after noticing foul smell from her c-section incision.  She is post-partum 4 week.  Rash noted to L neck, b/l UE.  Reports generalized itching.  Received benadryl in triage

## 2015-06-11 NOTE — ED Provider Notes (Signed)
[facility] Emergency Department Provider Note  ____________________________________________  Time seen: Approximately 244 AM  I have reviewed the triage vital signs and the nursing notes.   HISTORY  Chief Complaint Rash and Allergic Reaction    HPI Stacy Best is a 35 y.o. female who comes into the hospital today with an allergic reaction. She reports that she started taking some Keflex yesterday and noticed some redness and itching to her skin yesterday. She reports that she noticed some smell from her C-section scar and some redness in her doctor called in this medication. She feels itchy all over. She denies any shortness of breath or swelling in her throat. She did not take anything for the symptoms but decided to come in and get checked out. The rashes on her arms and around her neck as well as spots on her abdomen.   Past Medical History  Diagnosis Date  . Need for Tdap vaccination     28 weeks  . Gestational diabetes   . Headache     Patient Active Problem List   Diagnosis Date Noted  . Gestational thrombocytopenia (HCC) 05/11/2015  . Fetal macrosomia, delivered, current hospitalization 05/11/2015  . S/P cesarean section 05/10/2015  . Preterm labor in third trimester without delivery 04/17/2015  . Medically noncompliant 04/12/2015  . Gestational diabetes mellitus 01/23/2015  . Obesity in pregnancy 11/17/2014  . Advanced maternal age in multigravida 11/17/2014  . H/O cesarean section complicating pregnancy 11/17/2014  . H/O miscarriage, currently pregnant 11/17/2014  . Normal pregnancy, incidental 05/02/2013    Past Surgical History  Procedure Laterality Date  . Cesarean section  2015  . Ankle fracture surgery Left   . Fracture surgery    . Cesarean section N/A 05/10/2015    Procedure: REPEAT C SECTION;  Surgeon: Hildred Laser, MD;  Location: [facility] ORS;  Service: Obstetrics;  Laterality: N/A;  PUT ON THE BOOKS WITH L&D FOR 7:30AM     Current Outpatient Rx  Name  Route  Sig  Dispense  Refill  . cephALEXin (KEFLEX) 500 MG capsule   Oral   Take 1 capsule (500 mg total) by mouth 2 (two) times daily.   14 capsule   0   . docusate sodium (COLACE) 100 MG capsule   Oral   Take 1 capsule (100 mg total) by mouth 2 (two) times daily as needed.   30 capsule   2   . ibuprofen (ADVIL,MOTRIN) 800 MG tablet   Oral   Take 1 tablet (800 mg total) by mouth every 8 (eight) hours as needed.   60 tablet   1   . nitrofurantoin, macrocrystal-monohydrate, (MACROBID) 100 MG capsule   Oral   Take 1 capsule (100 mg total) by mouth 2 (two) times daily.   14 capsule   0   . oxyCODONE-acetaminophen (PERCOCET/ROXICET) 5-325 MG tablet   Oral   Take 1 tablet by mouth every 6 (six) hours as needed for moderate pain.   30 tablet   0   . predniSONE (DELTASONE) 20 MG tablet   Oral   Take 3 tablets (60 mg total) by mouth daily.   12 tablet   0   . Prenatal Vit-Fe Fumarate-FA (PRENATAL VITAMIN PLUS LOW IRON) 27-1 MG TABS   Oral   Take 1 tablet by mouth daily.   30 tablet   6     Allergies Review of patient's allergies indicates no known allergies.  Family History  Problem Relation Age of Onset  .  Breast cancer Mother 51  . Diabetes Father   . Ovarian cancer Neg Hx   . Colon cancer Neg Hx   . Heart disease Neg Hx     Social History Social History  Substance Use Topics  . Smoking status: Never Smoker   . Smokeless tobacco: Never Used  . Alcohol Use: No     Comment: 2 per month prior to pregnancy    Review of Systems Constitutional: No fever/chills Eyes: No visual changes. ENT: No sore throat. Cardiovascular: Denies chest pain. Respiratory: Denies shortness of breath. Gastrointestinal: No abdominal pain.  No nausea, no vomiting.  No diarrhea.  No constipation. Genitourinary: Negative for dysuria. Musculoskeletal: Negative for back pain. Skin:  rash. Neurological: Negative for headaches, focal weakness or  numbness.  10-point ROS otherwise negative.  ____________________________________________   PHYSICAL EXAM:  VITAL SIGNS: ED Triage Vitals  Enc Vitals Group     BP 06/11/15 0154 95/48 mmHg     Pulse Rate 06/11/15 0154 69     Resp 06/11/15 0154 20     Temp 06/11/15 0154 97.6 F (36.4 C)     Temp Source 06/11/15 0154 Oral     SpO2 06/11/15 0154 99 %     Weight 06/11/15 0154 160 lb (72.576 kg)     Height 06/11/15 0154 5' (1.524 m)     Head Cir --      Peak Flow --      Pain Score --      Pain Loc --      Pain Edu? --      Excl. in GC? --     Constitutional: Alert and oriented. Well appearing and in mild distress. Eyes: Conjunctivae are normal. PERRL. EOMI. Head: Atraumatic. Nose: No congestion/rhinnorhea. Mouth/Throat: Mucous membranes are moist.  Oropharynx non-erythematous. Cardiovascular: Normal rate, regular rhythm. Grossly normal heart sounds.  Good peripheral circulation. Respiratory: Normal respiratory effort.  No retractions. Lungs CTAB. Gastrointestinal: Soft and nontender. No distention. Positive bowel sounds Musculoskeletal: No lower extremity tenderness nor edema.   Neurologic:  Normal speech and language.  Skin:  Skin is warm, dry and intact. Erythema to wrists, elbows and neck. Hives to patient's abdomen. Psychiatric: Mood and affect are normal.   ____________________________________________   LABS (all labs ordered are listed, but only abnormal results are displayed)  Labs Reviewed - No data to display ____________________________________________  EKG  None ____________________________________________  RADIOLOGY  None ____________________________________________   PROCEDURES  Procedure(s) performed: None  Critical Care performed: No  ____________________________________________   INITIAL IMPRESSION / ASSESSMENT AND PLAN / ED COURSE  Pertinent labs & imaging results that were available during my care of the patient were reviewed by me  and considered in my medical decision making (see chart for details).  This is a 35 year old female who comes into the hospital today with some itching and rash that started yesterday after she began Keflex. I will give the patient some prednisone as well as Pepcid. The patient did receive a dose of Benadryl in triage. I'll reassess the patient and have her stop taking the Keflex. I will reassess her and discharge her once her symptoms are improving.  The patient will be discharged to home to follow-up with the acute care clinic. I will have her follow back up with her OB/GYN to determine if she needs antibiotics or antifungals near her C-section site. There is no significant drainage but there is some maculopapular rash which may be more consistent with a Candida infection versus a  bacterial infection. ____________________________________________   FINAL CLINICAL IMPRESSION(S) / ED DIAGNOSES  Final diagnoses:  Allergic reaction, initial encounter  Hives      Rebecka Apley, MD 06/11/15 [phone]

## 2015-06-11 NOTE — Discharge Instructions (Signed)
Allergies °An allergy is an abnormal reaction to a substance by the body's defense system (immune system). Allergies can develop at any age. °WHAT CAUSES ALLERGIES? °An allergic reaction happens when the immune system mistakenly reacts to a normally harmless substance, called an allergen, as if it were harmful. The immune system releases antibodies to fight the substance. Antibodies eventually release a chemical called histamine into the bloodstream. The release of histamine is meant to protect the body from infection, but it also causes discomfort. °An allergic reaction can be triggered by: °· Eating an allergen. °· Inhaling an allergen. °· Touching an allergen. °WHAT TYPES OF ALLERGIES ARE THERE? °There are many types of allergies. Common types include: °· Seasonal allergies. People with this type of allergy are usually allergic to substances that are only present during certain seasons, such as molds and pollens. °· Food allergies. °· Drug allergies. °· Insect allergies. °· Animal dander allergies. °WHAT ARE SYMPTOMS OF ALLERGIES? °Possible allergy symptoms include: °· Swelling of the lips, face, tongue, mouth, or throat. °· Sneezing, coughing, or wheezing. °· Nasal congestion. °· Tingling in the mouth. °· Rash. °· Itching. °· Itchy, red, swollen areas of skin (hives). °· Watery eyes. °· Vomiting. °· Diarrhea. °· Dizziness. °· Lightheadedness. °· Fainting. °· Trouble breathing or swallowing. °· Chest tightness. °· Rapid heartbeat. °HOW ARE ALLERGIES DIAGNOSED? °Allergies are diagnosed with a medical and family history and one or more of the following: °· Skin tests. °· Blood tests. °· A food diary. A food diary is a record of all the foods and drinks you have in a day and of all the symptoms you experience. °· The results of an elimination diet. An elimination diet involves eliminating foods from your diet and then adding them back in one by one to find out if a certain food causes an allergic reaction. °HOW ARE  ALLERGIES TREATED? °There is no cure for allergies, but allergic reactions can be treated with medicine. Severe reactions usually need to be treated at a hospital. °HOW CAN REACTIONS BE PREVENTED? °The best way to prevent an allergic reaction is by avoiding the substance you are allergic to. Allergy shots and medicines can also help prevent reactions in some cases. People with severe allergic reactions may be able to prevent a life-threatening reaction called anaphylaxis with a medicine given right after exposure to the allergen. °  °This information is not intended to replace advice given to you by your health care provider. Make sure you discuss any questions you have with your health care provider. °  °Document Released: 06/24/2002 Document Revised: 04/21/2014 Document Reviewed: 01/10/2014 °Elsevier Interactive Patient Education ©2016 [employer] ° °Hives °Hives are itchy, red, swollen areas of the skin. They can vary in size and location on your body. Hives can come and go for hours or several days (acute hives) or for several weeks (chronic hives). Hives do not spread from person to person (noncontagious). They may get worse with scratching, exercise, and emotional stress. °CAUSES  °· Allergic reaction to food, additives, or drugs. °· Infections, including the common cold. °· Illness, such as vasculitis, lupus, or thyroid disease. °· Exposure to sunlight, heat, or cold. °· Exercise. °· Stress. °· Contact with chemicals. °SYMPTOMS  °· Red or white swollen patches on the skin. The patches may change size, shape, and location quickly and repeatedly. °· Itching. °· Swelling of the hands, feet, and face. This may occur if hives develop deeper in the skin. °DIAGNOSIS  °Your caregiver can usually tell   what is wrong by performing a physical exam. Skin or blood tests may also be done to determine the cause of your hives. In some cases, the cause cannot be determined. °TREATMENT  °Mild cases usually get better with  medicines such as antihistamines. Severe cases may require an emergency epinephrine injection. If the cause of your hives is known, treatment includes avoiding that trigger.  °HOME CARE INSTRUCTIONS  °· Avoid causes that trigger your hives. °· Take antihistamines as directed by your caregiver to reduce the severity of your hives. Non-sedating or low-sedating antihistamines are usually recommended. Do not drive while taking an antihistamine. °· Take any other medicines prescribed for itching as directed by your caregiver. °· Wear loose-fitting clothing. °· Keep all follow-up appointments as directed by your caregiver. °SEEK MEDICAL CARE IF:  °· You have persistent or severe itching that is not relieved with medicine. °· You have painful or swollen joints. °SEEK IMMEDIATE MEDICAL CARE IF:  °· You have a fever. °· Your tongue or lips are swollen. °· You have trouble breathing or swallowing. °· You feel tightness in the throat or chest. °· You have abdominal pain. °These problems may be the first sign of a life-threatening allergic reaction. Call your local emergency services (911 in [place] °MAKE SURE YOU:  °· Understand these instructions. °· Will watch your condition. °· Will get help right away if you are not doing well or get worse. °  °This information is not intended to replace advice given to you by your health care provider. Make sure you discuss any questions you have with your health care provider. °  °Document Released: 03/31/2005 Document Revised: 04/05/2013 Document Reviewed: 06/24/2011 °Elsevier Interactive Patient Education ©2016 [employer]vier Inc. ° °

## 2015-06-11 NOTE — ED Notes (Addendum)
Pt has hives and is itching all over.  Sx began yesterday afternoon.  Pt had recent c-section and has had rash and drainage from incision.  Pt taking antibiodics.  Pt also reports eating shrimp yesterday.  No resp distress.  Pt alert.

## 2015-06-11 NOTE — ED Notes (Signed)
Pt discharged to home with family member, VS, afebrile.  Discharge paperwork reviewed with patient.  Prescriptions provided to pt with follow-up care and instructions.

## 2015-06-12 ENCOUNTER — Ambulatory Visit: Payer: Medicaid Other | Admitting: Obstetrics and Gynecology

## 2015-06-12 ENCOUNTER — Encounter: Payer: Self-pay | Admitting: Obstetrics and Gynecology

## 2015-06-12 ENCOUNTER — Ambulatory Visit (INDEPENDENT_AMBULATORY_CARE_PROVIDER_SITE_OTHER): Payer: Medicaid Other | Admitting: Obstetrics and Gynecology

## 2015-06-12 VITALS — BP 100/66 | HR 92 | Ht 60.0 in | Wt 161.5 lb

## 2015-06-12 DIAGNOSIS — T7840XA Allergy, unspecified, initial encounter: Secondary | ICD-10-CM

## 2015-06-12 DIAGNOSIS — E669 Obesity, unspecified: Secondary | ICD-10-CM

## 2015-06-12 DIAGNOSIS — Z889 Allergy status to unspecified drugs, medicaments and biological substances status: Secondary | ICD-10-CM

## 2015-06-12 DIAGNOSIS — B372 Candidiasis of skin and nail: Secondary | ICD-10-CM

## 2015-06-12 MED ORDER — PANTOPRAZOLE SODIUM 40 MG PO TBEC: 40.0000 mg | DELAYED_RELEASE_TABLET | Freq: Every day | ORAL | Status: DC

## 2015-06-12 MED ORDER — NYSTATIN 100000 UNIT/GM EX CREA: 1.0000 "application " | TOPICAL_CREAM | Freq: Two times a day (BID) | CUTANEOUS | Status: DC

## 2015-06-12 MED ORDER — ALUMINUM & MAGNESIUM HYDROXIDE 200-200 MG/5ML PO SUSP: 15.0000 mL | Freq: Four times a day (QID) | ORAL | Status: DC | PRN

## 2015-06-18 NOTE — Progress Notes (Signed)
     OBSTETRICS CLINIC PROGRESS NOTE  Subjective:    Stacy Best is a 35 y.o. [phone] female seen in for f/u evaluation after being seen in the Emergency Room yesterday for possible cephalosporin allergy. Patient had symptoms of skin rash and urticaria upon treatment with Keflex (given for suspected cellulitis of recent C-section scar). This medication was given by mouth. The reaction occurred after the first dose of the drug. Symptoms occurred within 1 day after the dose. The patient was treated with avoidance measures, antihistamines, oral steroids and emergency room care. The patient has not taken penicillin since.  Patient notes that she was given a prescription for steroids to take, however has not picked up prescription. Has been taking Benadryl at home, which initially helped, but now rash is returning.   The following portions of the patient's history were reviewed and updated as appropriate: allergies, current medications, past family history, past medical history, past social history, past surgical history and problem list.   Review of Systems A comprehensive review of systems was negative except for: Cardiovascular: positive for chest pressure/discomfort    Objective:  Blood pressure 100/66, pulse 92, height 5' (1.524 m), weight 161 lb 8 oz (73.256 kg), not currently breastfeeding. Body mass index is 31.54 kg/(m^2).    Throat: lips, mucosa, and tongue normal; teeth and gums normal Neck: no adenopathy, no carotid bruit, no JVD, supple, symmetrical, trachea midline and thyroid not enlarged, symmetric, no tenderness/mass/nodules Lungs: clear to auscultation bilaterally Heart: regular rate and rhythm, S1, S2 normal, no murmur, click, rub or gallop Abdomen: soft, non-tender; bowel sounds normal; no masses,  no organomegaly and Incision site with areas of erythema, more consistent with candidiasis Pelvic: deferred Extremities: no edema, redness or tenderness in the calves or  thighs Skin: mobility and turgor normal and temperature normal.   Erythema to wrists, elbows and neck. Hives to patient's abdomen.  Assessment:    Cephalosporin (Keflex) allergy)    Suspected yeast candidiasis of incision site Obesity   Plan:    Medications: discontinue Keflex.  Prescribed Nystatin cream to apply to incision site.  Advised patient to pick up prescription for steroids and begin immediately.  Continue with Benadryl as needed for itching  To f/u immediately in office if symptoms worsen, in ER if symptoms of anaphylaxis occur (SOB, throat swelling/closing).   To f/u in 1 week for postpartum visit.    Hildred LaserAnika Reve Crocket, MD [facility] 06/18/2015 10:14 PM

## 2015-06-21 ENCOUNTER — Ambulatory Visit (INDEPENDENT_AMBULATORY_CARE_PROVIDER_SITE_OTHER): Payer: Medicaid Other | Admitting: Obstetrics and Gynecology

## 2015-06-21 ENCOUNTER — Encounter: Payer: Self-pay | Admitting: Obstetrics and Gynecology

## 2015-06-21 DIAGNOSIS — O24419 Gestational diabetes mellitus in pregnancy, unspecified control: Secondary | ICD-10-CM

## 2015-06-21 DIAGNOSIS — Z308 Encounter for other contraceptive management: Secondary | ICD-10-CM

## 2015-06-21 DIAGNOSIS — O9902 Anemia complicating childbirth: Secondary | ICD-10-CM

## 2015-06-21 DIAGNOSIS — E669 Obesity, unspecified: Secondary | ICD-10-CM

## 2015-06-21 MED ORDER — MEDROXYPROGESTERONE ACETATE 150 MG/ML IM SUSP: 150.0000 mg | INTRAMUSCULAR | Status: DC

## 2015-06-21 NOTE — Progress Notes (Signed)
   OBSTETRICS POSTPARTUM CLINIC PROGRESS NOTE  Subjective:     Stacy Best is a 35 y.o. [id number] female who presents for a postpartum visit. She is 6 weeks postpartum following a low cervical transverse Cesarean section. I have fully reviewed the prenatal and intrapartum course. The delivery was at 37.4 gestational weeks.  The preganncy was complicated by uncontrolled gestational diabetes (on Glyburide).  Anesthesia: spinal. Postpartum course has been complicated by wound cellulitis with allergic reaction to antibiotic therapy. Baby's course has been well. Baby is feeding by breast. Bleeding: patient has/has not resumed menses, with No LMP recorded.. Bowel function is normal. Bladder function is normal. Patient is not sexually active. Contraception method desired is tubal ligation. Postpartum depression screening: negative.  The following portions of the patient's history were reviewed and updated as appropriate: allergies, current medications, past family history, past medical history, past social history, past surgical history and problem list.  Review of Systems Pertinent items noted in HPI and remainder of comprehensive ROS otherwise negative.   Objective:    BP 121/72 mmHg  Pulse 74  Ht 5' (1.524 m)  Wt 162 lb 3.2 oz (73.573 kg)  BMI 31.68 kg/m2  Breastfeeding? No  General:  alert and no distress   Breasts:  inspection negative, no nipple discharge or bleeding, no masses or nodularity palpable  Lungs: clear to auscultation bilaterally  Heart:  regular rate and rhythm, S1, S2 normal, no murmur, click, rub or gallop  Abdomen: soft, non-tender; bowel sounds normal; no masses,  no organomegaly. Well healed Pfannenstiel incision..  Scant fragments of staples noted at left lateral skin edge, absorbing. Faint areas of residual macular rash noted on right lateral edge of incision.    Vulva:  normal  Vagina: normal vagina, no discharge, exudate, lesion, or erythema  Cervix:  no cervical  motion tenderness and no lesions  Corpus: normal size, contour, position, consistency, mobility, non-tender  Adnexa:  normal adnexa and no mass, fullness, tenderness  Rectal Exam: Not performed.         Labs:  Lab Results  Component Value Date   HGB 10.6* 05/11/2015     Assessment:   Routine postpartum exam.   H/o gestational diabetes in pregnancy Multiparity, desires permanent sterilization Anemia of pregnancy, mild Obesity, Class I   Plan:    1. Contraception: desires tubal ligation.  Other reversible forms of contraception were discussed with patient; she declines all other modalities. Risks of procedure discussed with patient including but not limited to: risk of regret, permanence of method, bleeding, infection, injury to surrounding organs and need for additional procedures.  Failure risk of 1-2 % with increased risk of ectopic gestation if pregnancy occurs was also discussed with patient.  Patient verbalized understanding of these risks and wants to proceed with sterilization. Discussed method of sterilization, including laparoscopic vs hysteroscopic Essure.  Patient desires Essure.  Discussed 3 month HSG post-procedure.   Will administer dose of Depo Provera next week.  Desires surgery on 07/09/2015. Pre-op done today.  2. Will check Hgb for h/o anemia with pre-op labs. . 3. For 2 hr postpartum glucola today.  4. Discussed lifestle management (dietar management, exercise) for obesity.  Follow up in: 4 weeks for post-op visit, or as needed.    Hildred LaserAnika Labrandon Knoch, MD [facility]

## 2015-06-22 LAB — GLUCOSE TOLERANCE, 2 HOURS
GLUCOSE FASTING GTT: 95 mg/dL (ref 65–99)
Glucose, 2 hour: 115 mg/dL (ref 65–139)

## 2015-06-25 ENCOUNTER — Encounter: Payer: Self-pay | Admitting: Obstetrics and Gynecology

## 2015-06-25 ENCOUNTER — Telehealth: Payer: Self-pay

## 2015-06-25 NOTE — Telephone Encounter (Signed)
-----   Message from Hildred LaserAnika Cherry, MD sent at 06/22/2015  9:13 AM EST ----- Please inform of normal 2 hr glucose tolerance test.

## 2015-06-25 NOTE — Telephone Encounter (Signed)
Called pt no answer, LM informing pt of normal 2 hour glucose.

## 2015-06-25 NOTE — H&P (Signed)
GYNECOLOGY PRE-OPERATIVE HISTORY AND PHYSICAL    Subjective:    Patient is a 35 y.o. [id number] female scheduled for hysteroscopic tubal ligation (Essure) Indications for procedure are multiparity, desiring permanent sterilization.     Pertinent Gynecological History: Menses: has not resumed postpartum Contraception: abstinence  Discussed Blood/Blood Products: no   Menstrual History Menarche age: 62 No LMP recorded.      Obstetric History   G4   P2   T2   P0   A2   TAB1   SAB1   E0   M0   L2     # Outcome Date GA Lbr Len/2nd Weight Sex Delivery Anes PTL Lv  4 Term 05/10/15 [redacted]w[redacted]d  10 lb (4.536 kg) M CS-LTranv Spinal  Y     Name: MEZA LARA,BOY Thersia     Complications: Gestational diabetes mellitus     Apgar1:  8                Apgar5: 9  3 Term 2015 [redacted]w[redacted]d  9 lb (4.082 kg) F CS-Unspec EPI  Y     Name: [name]     Complications: Chorioamnionitis,Tachycardia  2 TAB 2003          1 SAB 2000            Obstetric Comments  Patient dilated to 9 cm prior to C-section for NRFHT (persistent tachycardia due to chorioamnionitis); prolonged labor    Past Medical History  Diagnosis Date  . Need for Tdap vaccination     28 weeks  . Gestational diabetes   . Headache     Past Surgical History  Procedure Laterality Date  . Cesarean section  2015  . Ankle fracture surgery Left   . Fracture surgery    . Cesarean section N/A 05/10/2015    Procedure: REPEAT C SECTION;  Surgeon: Hildred Laser, MD;  Location: [facility] ORS;  Service: Obstetrics;  Laterality: N/A;  PUT ON THE BOOKS WITH L&D FOR 7:30AM    Family History  Problem Relation Age of Onset  . Breast cancer Mother 21  . Diabetes Father   . Ovarian cancer Neg Hx   . Colon cancer Neg Hx   . Heart disease Neg Hx     Social History   Social History  . Marital Status: Single    Spouse Name: N/A  . Number of Children: N/A  . Years of Education: N/A   Social History Main Topics  . Smoking status: Never Smoker   . Smokeless  tobacco: Never Used  . Alcohol Use: No     Comment: 2 per month prior to pregnancy  . Drug Use: No  . Sexual Activity: Yes    Birth Control/ Protection: None   Other Topics Concern  . None   Social History Narrative     Outpatient Encounter Prescriptions as of 06/21/2015  Medication Sig  . medroxyPROGESTERone (DEPO-PROVERA) 150 MG/ML injection Inject 1 mL (150 mg total) into the muscle every 3 (three) months.   No facility-administered encounter medications on file as of 06/21/2015.     Allergies  Allergen Reactions  . Keflex [Cephalexin] Hives and Rash    Review of Systems Constitutional: No recent fever/chills/sweats Respiratory: No recent cough/bronchitis Cardiovascular: No chest pain Gastrointestinal: No recent nausea/vomiting/diarrhea Genitourinary: No UTI symptoms Hematologic/lymphatic:No history of coagulopathy or recent blood thinner use    Objective:    BP 121/72 mmHg  Pulse 74  Ht 5' (1.524 m)  Wt 162 lb 3.2 oz (  73.573 kg)  BMI 31.68 kg/m2  Breastfeeding? No  General:   Normal  Skin:   normal  HEENT:  Normal  Neck:  Supple without Adenopathy or Thyromegaly  Lungs:   Heart:              Breasts:   Abdomen:  Pelvis:  M/S   Extremeties:  Neuro:    clear to auscultation bilaterally   Normal without murmur   Not Examined   soft, non-tender; bowel sounds normal; no masses,  no organomegaly.  Faint macular rash present near incision site.    Exam deferred to OR  No CVAT  Warm/Dry   Normal          Assessment:      Multiparity, desiring permanent sterilization with Essure.        Plan:   Counseling: Procedure, risks, reasons, benefits and complications (including injury to bowel, bladder, major blood vessel, ureter, bleeding, possibility of transfusion, infection, or fistula formation) reviewed in detail. Discussed need for HSG 3 months postpartum.  Also discussed possibility of laparoscopy if Essure is unable to be performed.  Preop testing  ordered. Instructions reviewed, including NPO after midnight on evening prior to procedure.  Surgery scheduled for 07/09/2015.    Hildred LaserAnika Worthington Cruzan, MD [facility]

## 2015-06-28 ENCOUNTER — Ambulatory Visit (INDEPENDENT_AMBULATORY_CARE_PROVIDER_SITE_OTHER): Payer: Medicaid Other | Admitting: Obstetrics and Gynecology

## 2015-06-28 VITALS — BP 110/64 | HR 68 | Wt 165.3 lb

## 2015-06-28 DIAGNOSIS — Z3042 Encounter for surveillance of injectable contraceptive: Secondary | ICD-10-CM | POA: Diagnosis not present

## 2015-06-28 LAB — POCT URINE PREGNANCY: Preg Test, Ur: NEGATIVE

## 2015-06-28 MED ORDER — MEDROXYPROGESTERONE ACETATE 150 MG/ML IM SUSP
150.0000 mg | Freq: Once | INTRAMUSCULAR | Status: AC
  Administered 2015-06-28: 150 mg via INTRAMUSCULAR

## 2015-06-28 NOTE — Progress Notes (Signed)
Patient ID: Stacy BlackbirdLorena Meza Best, female   DOB: Aug 18, 1980, 35 y.o.   MRN: [id number] Pt presents for depo-provera injection for future tubal ligation, and Essure procedure.

## 2015-07-09 MED ORDER — SODIUM CHLORIDE 0.9 % IV SOLN
INTRAVENOUS | Status: AC
  Filled 2015-07-09: qty 1000

## 2015-07-11 ENCOUNTER — Ambulatory Visit (INDEPENDENT_AMBULATORY_CARE_PROVIDER_SITE_OTHER): Payer: Medicaid Other | Admitting: Obstetrics and Gynecology

## 2015-07-11 ENCOUNTER — Other Ambulatory Visit: Payer: Medicaid Other

## 2015-07-11 ENCOUNTER — Encounter: Payer: Self-pay | Admitting: *Deleted

## 2015-07-11 ENCOUNTER — Encounter: Payer: Self-pay | Admitting: Obstetrics and Gynecology

## 2015-07-11 VITALS — BP 112/54 | HR 79 | Ht 60.0 in | Wt 163.3 lb

## 2015-07-11 DIAGNOSIS — Z01818 Encounter for other preprocedural examination: Secondary | ICD-10-CM

## 2015-07-11 DIAGNOSIS — E669 Obesity, unspecified: Secondary | ICD-10-CM

## 2015-07-11 NOTE — Patient Instructions (Signed)
  Your procedure is scheduled on: 07-16-15 (MONDAY) Report to [facility] SAME [facility] 2ND FLOOR. To find out your arrival time please call [phone] between 1PM - 3PM on 07-13-15 [facility](THURSDAY)  Remember: Instructions that are not followed completely may result in serious medical risk, up to and including death, or upon the discretion of your surgeon and anesthesiologist your surgery may need to be rescheduled.    _X___ 1. Do not eat food or drink liquids after midnight. No gum chewing or hard candies.     _X___ 2. No Alcohol for 24 hours before or after surgery.   ____ 3. Bring all medications with you on the day of surgery if instructed.    _X___ 4. Notify your doctor if there is any change in your medical condition     (cold, fever, infections).     Do not wear jewelry, make-up, hairpins, clips or nail polish.  Do not wear lotions, powders, or perfumes. You may wear deodorant.  Do not shave 48 hours prior to surgery. Men may shave face and neck.  Do not bring valuables to the hospital.    [facility] is not responsible for any belongings or valuables.               Contacts, dentures or bridgework may not be worn into surgery.  Leave your suitcase in the car. After surgery it may be brought to your room.  For patients admitted to the hospital, discharge time is determined by your treatment team.   Patients discharged the day of surgery will not be allowed to drive home.   Please read over the following fact sheets that you were given:    ____ Take these medicines the morning of surgery with A SIP OF WATER:    1. NONE  2.   3.   4.  5.  6.  ____ Fleet Enema (as directed)   _X__ Use CHG Soap as directed  ____ Use inhalers on the day of surgery  ____ Stop metformin 2 days prior to surgery    ____ Take 1/2 of usual insulin dose the night before surgery and none on the morning of surgery.   ____ Stop Coumadin/Plavix/aspirin-N/A  _X___ Stop Anti-inflammatories-STOP  IBUPROFEN NOW-NO NSAIDS OR ASPIRIN PRODUCTS-TYLENOL OK TO TAKE   ____ Stop supplements until after surgery.    ____ Bring C-Pap to the hospital.

## 2015-07-11 NOTE — H&P (Signed)
GYNECOLOGY PRE-OPERATIVE HISTORY AND PHYSICAL   Subjective:    Patient is a 35 y.o. [id number] female scheduled for Hysteroscopic Essure Tubal Ligation. Indications for procedure are multiparity, desiring permanent sterilization.   Pertinent Gynecological History: Menarche age: [age] Menses: regular every 28 days without intermenstrual spotting.  Has not had a menstrual cycle since postpartum.  Contraception: Depo-Provera injections currently.   Last pap: normal Date: 2015  Discussed Blood/Blood Products: not applicable    Obstetric History   G4   P2   T2   P0   A2   TAB1   SAB1   E0   M0   L2     # Outcome Date GA Lbr Len/2nd Weight Sex Delivery Anes PTL Lv  4 Term 05/10/15 6541w4d  10 lb (4.536 kg) M CS-LTranv Spinal  Y     Name: MEZA LARA,BOY Lillyona     Complications: Gestational diabetes mellitus     Apgar1:  8                Apgar5: 9  3 Term 2015 1778w0d  9 lb (4.082 kg) F CS-Unspec EPI  Y     Name: [name]     Complications: Chorioamnionitis,Tachycardia  2 TAB 2003          1 SAB 2000            Obstetric Comments  Patient dilated to 9 cm prior to C-section for NRFHT (persistent tachycardia due to chorioamnionitis); prolonged labor    Past Medical History  Diagnosis Date  . Need for Tdap vaccination     28 weeks  . Gestational diabetes   . Headache     Past Surgical History  Procedure Laterality Date  . Cesarean section  2015  . Ankle fracture surgery Left   . Fracture surgery    . Cesarean section N/A 05/10/2015    Social History   Social History  . Marital Status: Single    Spouse Name: N/A  . Number of Children: N/A  . Years of Education: N/A   Social History Main Topics  . Smoking status: Never Smoker   . Smokeless tobacco: Never Used  . Alcohol Use: No     Comment: 2 per month prior to pregnancy  . Drug Use: No  . Sexual Activity: Yes    Birth Control/ Protection: None   Other Topics Concern  . Not on file   Social History Narrative    Family  History  Problem Relation Age of Onset  . Breast cancer Mother 1943  . Diabetes Father   . Ovarian cancer Neg Hx   . Colon cancer Neg Hx   . Heart disease Neg Hx     Current Outpatient Prescriptions on File Prior to Visit  Medication Sig Dispense Refill  . medroxyPROGESTERone (DEPO-PROVERA) 150 MG/ML injection Inject 1 mL (150 mg total) into the muscle every 3 (three) months. 1 mL 0   No current facility-administered medications on file prior to visit.     Allergies  Allergen Reactions  . Keflex [Cephalexin] Hives and Rash    Review of Systems Constitutional: No recent fever/chills/sweats Respiratory: No recent cough/bronchitis Cardiovascular: No chest pain Gastrointestinal: No recent nausea/vomiting/diarrhea Genitourinary: No UTI symptoms Hematologic/lymphatic:No history of coagulopathy or recent blood thinner use    Objective:    BP 112/54 mmHg  Pulse 79  Ht 5' (1.524 m)  Wt 163 lb 4.8 oz (74.072 kg)  BMI 31.89 kg/m2  Breastfeeding? No  General:  Normal, obese  Skin:   normal  HEENT:  Normal  Neck:  Supple without Adenopathy or Thyromegaly  Lungs:   Heart:              Breasts:   Abdomen:  Pelvis:  M/S   Extremeties:  Neuro:    clear to auscultation bilaterally   Normal without murmur   Not Examined   soft, non-tender; bowel sounds normal; no masses,  no organomegaly, well healed Pfannenstiel incision.    Exam deferred to OR  No CVAT  Warm/Dry   Normal          Assessment:    Multiparity, desiring permanent sterilization Obesity (Class 1)      Plan:    Counseling: Procedure, risks, reasons, benefits and complications (including injury to bowel, bladder, major blood vessel, ureter, bleeding, possibility of transfusion, infection, or fistula formation) reviewed in detail.  Patient desires permanent sterilization.  Other reversible forms of contraception were discussed with patient; she declines all other modalities. Risks of procedure discussed  with patient including but not limited to: risk of regret, permanence of method, bleeding, infection, injury to surrounding organs and need for additional procedures.  Failure risk of 1-2 % with increased risk of ectopic gestation if pregnancy occurs was also discussed with patient.  Also understands the need for confirmatory HSG 3 months after current procedure to ensure tubal occlusion. Patient verbalized understanding  and wants to proceed with sterilization.  Written informed consent obtained.  Consent signed. Preop testing ordered. Given Depo Provera on 06/28/2015 for thinning endometrium and backup contraceptive method.  Instructions reviewed, including NPO after midnight.   Hildred Laser, MD [facility]

## 2015-07-11 NOTE — Progress Notes (Signed)
    GYNECOLOGY PROGRESS NOTE  Subjective:    Patient ID: Stacy Best, female    DOB: 10/18/1980, 35 y.o.   MRN: [id number]  HPI  Patient is a 35 y.o. [id number] female who presents for pre-op for Essure tubal ligation. Denies complaints.   The following portions of the patient's history were reviewed and updated as appropriate: allergies, current medications, past family history, past medical history, past social history, past surgical history and problem list.  Review of Systems A comprehensive review of systems was negative.   Objective:   Blood pressure 112/54, pulse 79, height 5' (1.524 m), weight 163 lb 4.8 oz (74.072 kg), not currently breastfeeding. General appearance: alert and no distress Abdomen: soft, non-tender; bowel sounds normal; no masses,  no organomegaly   Well healed Pfannenstiel incision.  Pelvic: cervix normal in appearance, external genitalia normal, no adnexal masses or tenderness, no cervical motion tenderness, rectovaginal septum normal, uterus normal size, shape, and consistency and vagina normal without discharge Extremities: extremities normal, atraumatic, no cyanosis or edema Neurologic: Grossly normal   Assessment:   Multiparity, desiring permanent sterilization Obesity (Class 1)  Plan:  Counseling: Procedure, risks, reasons, benefits and complications (including injury to bowel, bladder, major blood vessel, ureter, bleeding, possibility of transfusion, infection, or fistula formation) reviewed in detail. Patient desires permanent sterilization. Other reversible forms of contraception were discussed with patient; she declines all other modalities. Risks of procedure discussed with patient including but not limited to: risk of regret, permanence of method, bleeding, infection, injury to surrounding organs and need for additional procedures. Failure risk of 1-2 % with increased risk of ectopic gestation if pregnancy occurs was also discussed with  patient. Also understands the need for confirmatory HSG 3 months after current procedure to ensure tubal occlusion. Patient verbalized understanding and wants to proceed with sterilization. Written informed consent obtained. Consent signed. Preop testing ordered. Given Depo Provera on 06/28/2015 for thinning endometrium and backup contraceptive method.  Instructions reviewed, including NPO after midnight.   Hildred LaserAnika Mukhtar Shams, MD [facility]

## 2015-07-12 ENCOUNTER — Encounter
Admission: RE | Admit: 2015-07-12 | Discharge: 2015-07-12 | Disposition: A | Discharge status: Home / Self Care | Payer: Medicaid Other | Source: Ambulatory Visit | Attending: Obstetrics and Gynecology | Admitting: Obstetrics and Gynecology

## 2015-07-12 DIAGNOSIS — Z01812 Encounter for preprocedural laboratory examination: Secondary | ICD-10-CM | POA: Insufficient documentation

## 2015-07-12 LAB — CBC
HEMATOCRIT: 38.1 % (ref 35.0–47.0)
HEMOGLOBIN: 12.9 g/dL (ref 12.0–16.0)
MCH: 29.4 pg (ref 26.0–34.0)
MCHC: 33.8 g/dL (ref 32.0–36.0)
MCV: 87.1 fL (ref 80.0–100.0)
Platelets: 231 10*3/uL (ref 150–440)
RBC: 4.38 MIL/uL (ref 3.80–5.20)
RDW: 14.3 % (ref 11.5–14.5)
WBC: 6.9 10*3/uL (ref 3.6–11.0)

## 2015-07-16 ENCOUNTER — Encounter
Admission: RE | Disposition: A | Discharge status: Home / Self Care | Payer: Self-pay | Source: Ambulatory Visit | Attending: Obstetrics and Gynecology

## 2015-07-16 ENCOUNTER — Encounter: Payer: Self-pay | Admitting: Anesthesiology

## 2015-07-16 ENCOUNTER — Ambulatory Visit: Payer: Medicaid Other | Admitting: Anesthesiology

## 2015-07-16 ENCOUNTER — Ambulatory Visit
Admission: RE | Admit: 2015-07-16 | Discharge: 2015-07-16 | Disposition: A | Discharge status: Home / Self Care | Payer: Medicaid Other | Source: Ambulatory Visit | Attending: Obstetrics and Gynecology | Admitting: Obstetrics and Gynecology

## 2015-07-16 DIAGNOSIS — Z641 Problems related to multiparity: Secondary | ICD-10-CM | POA: Insufficient documentation

## 2015-07-16 DIAGNOSIS — Z803 Family history of malignant neoplasm of breast: Secondary | ICD-10-CM | POA: Insufficient documentation

## 2015-07-16 DIAGNOSIS — Z79890 Hormone replacement therapy (postmenopausal): Secondary | ICD-10-CM | POA: Insufficient documentation

## 2015-07-16 DIAGNOSIS — F329 Major depressive disorder, single episode, unspecified: Secondary | ICD-10-CM | POA: Diagnosis not present

## 2015-07-16 DIAGNOSIS — Z8632 Personal history of gestational diabetes: Secondary | ICD-10-CM | POA: Insufficient documentation

## 2015-07-16 DIAGNOSIS — Z833 Family history of diabetes mellitus: Secondary | ICD-10-CM | POA: Insufficient documentation

## 2015-07-16 DIAGNOSIS — Z302 Encounter for sterilization: Secondary | ICD-10-CM | POA: Insufficient documentation

## 2015-07-16 HISTORY — DX: Depression, unspecified: F32.A

## 2015-07-16 HISTORY — PX: TUBAL LIGATION: SHX77

## 2015-07-16 HISTORY — DX: Major depressive disorder, single episode, unspecified: F32.9

## 2015-07-16 LAB — GLUCOSE, CAPILLARY: Glucose-Capillary: 74 mg/dL (ref 65–99)

## 2015-07-16 LAB — POCT PREGNANCY, URINE: Preg Test, Ur: NEGATIVE

## 2015-07-16 SURGERY — ESSURE TUBAL STERILIZATION
Anesthesia: General | Laterality: Bilateral | Wound class: Clean Contaminated

## 2015-07-16 MED ORDER — FAMOTIDINE 20 MG PO TABS
20.0000 mg | ORAL_TABLET | Freq: Once | ORAL | Status: AC
  Administered 2015-07-16: 20 mg via ORAL

## 2015-07-16 MED ORDER — MIDAZOLAM HCL 2 MG/2ML IJ SOLN
INTRAMUSCULAR | Status: DC | PRN
  Administered 2015-07-16: 2 mg via INTRAVENOUS

## 2015-07-16 MED ORDER — HYDROCODONE-ACETAMINOPHEN 5-325 MG PO TABS
1.0000 | ORAL_TABLET | Freq: Four times a day (QID) | ORAL | Status: DC | PRN
  Administered 2015-07-16: 1 via ORAL

## 2015-07-16 MED ORDER — LACTATED RINGERS IR SOLN
Status: DC | PRN
  Administered 2015-07-16: 1500 mL

## 2015-07-16 MED ORDER — HYDROCODONE-ACETAMINOPHEN 5-325 MG PO TABS
ORAL_TABLET | ORAL | Status: AC
  Administered 2015-07-16: 1 via ORAL
  Filled 2015-07-16: qty 1

## 2015-07-16 MED ORDER — EPHEDRINE SULFATE 50 MG/ML IJ SOLN
INTRAMUSCULAR | Status: DC | PRN
  Administered 2015-07-16: 5 mg via INTRAVENOUS

## 2015-07-16 MED ORDER — FENTANYL CITRATE (PF) 100 MCG/2ML IJ SOLN
25.0000 ug | INTRAMUSCULAR | Status: DC | PRN
Start: 2015-07-16 — End: 2015-07-16
  Administered 2015-07-16 (×4): 25 ug via INTRAVENOUS

## 2015-07-16 MED ORDER — LACTATED RINGERS IV SOLN
INTRAVENOUS | Status: DC
Start: 2015-07-16 — End: 2015-07-16
  Administered 2015-07-16: 14:00:00 via INTRAVENOUS

## 2015-07-16 MED ORDER — IBUPROFEN 800 MG PO TABS: 800.0000 mg | ORAL_TABLET | Freq: Three times a day (TID) | ORAL | Status: AC | PRN

## 2015-07-16 MED ORDER — PROPOFOL 10 MG/ML IV BOLUS
INTRAVENOUS | Status: DC | PRN
  Administered 2015-07-16: 150 mg via INTRAVENOUS

## 2015-07-16 MED ORDER — FAMOTIDINE 20 MG PO TABS
ORAL_TABLET | ORAL | Status: AC
  Administered 2015-07-16: 20 mg via ORAL
  Filled 2015-07-16: qty 1

## 2015-07-16 MED ORDER — FENTANYL CITRATE (PF) 100 MCG/2ML IJ SOLN
INTRAMUSCULAR | Status: AC
  Administered 2015-07-16: 25 ug via INTRAVENOUS
  Filled 2015-07-16: qty 2

## 2015-07-16 MED ORDER — GLYCOPYRROLATE 0.2 MG/ML IJ SOLN
INTRAMUSCULAR | Status: DC | PRN
  Administered 2015-07-16: .4 mg via INTRAVENOUS

## 2015-07-16 MED ORDER — ONDANSETRON HCL 4 MG/2ML IJ SOLN
INTRAMUSCULAR | Status: AC
  Administered 2015-07-16: 4 mg via INTRAVENOUS
  Filled 2015-07-16: qty 2

## 2015-07-16 MED ORDER — LIDOCAINE HCL (CARDIAC) 20 MG/ML IV SOLN
INTRAVENOUS | Status: DC | PRN
  Administered 2015-07-16: 60 mg via INTRAVENOUS

## 2015-07-16 MED ORDER — NEOSTIGMINE METHYLSULFATE 10 MG/10ML IV SOLN
INTRAVENOUS | Status: DC | PRN
  Administered 2015-07-16: 3 mg via INTRAVENOUS

## 2015-07-16 MED ORDER — SODIUM CHLORIDE 0.9 % IV SOLN
INTRAVENOUS | Status: DC
Start: 2015-07-16 — End: 2015-07-16
  Administered 2015-07-16: 16:00:00 via INTRAVENOUS

## 2015-07-16 MED ORDER — ROCURONIUM BROMIDE 100 MG/10ML IV SOLN
INTRAVENOUS | Status: DC | PRN
  Administered 2015-07-16: 30 mg via INTRAVENOUS

## 2015-07-16 MED ORDER — FENTANYL CITRATE (PF) 100 MCG/2ML IJ SOLN
INTRAMUSCULAR | Status: DC | PRN
  Administered 2015-07-16 (×4): 50 ug via INTRAVENOUS

## 2015-07-16 MED ORDER — HYDROCODONE-ACETAMINOPHEN 5-325 MG PO TABS: 1.0000 | ORAL_TABLET | Freq: Four times a day (QID) | ORAL | Status: AC | PRN

## 2015-07-16 MED ORDER — ONDANSETRON HCL 4 MG/2ML IJ SOLN
4.0000 mg | Freq: Once | INTRAMUSCULAR | Status: AC | PRN
  Administered 2015-07-16: 4 mg via INTRAVENOUS

## 2015-07-16 MED ORDER — ONDANSETRON HCL 4 MG/2ML IJ SOLN
INTRAMUSCULAR | Status: DC | PRN
  Administered 2015-07-16: 4 mg via INTRAVENOUS

## 2015-07-16 SURGICAL SUPPLY — 17 items
CATH ROBINSON RED A/P 16FR (CATHETERS) ×3 IMPLANT
DRAPE LAP W/FLUID (DRAPES) ×3 IMPLANT
GLOVE BIO SURGEON STRL SZ 6 (GLOVE) ×12 IMPLANT
GLOVE BIOGEL PI IND STRL 6.5 (GLOVE) ×4 IMPLANT
GLOVE BIOGEL PI INDICATOR 6.5 (GLOVE) ×8
GOWN STRL REUS W/ TWL LRG LVL3 (GOWN DISPOSABLE) ×2 IMPLANT
GOWN STRL REUS W/TWL LRG LVL3 (GOWN DISPOSABLE) ×4
IV LACTATED RINGERS 1000ML (IV SOLUTION) ×9 IMPLANT
KIT ESSURE FALLOPIAN CLOSURE (Ring) ×6 IMPLANT
KIT RM TURNOVER CYSTO AR (KITS) ×3 IMPLANT
PACK DNC HYST (MISCELLANEOUS) ×3 IMPLANT
PAD OB MATERNITY 4.3X12.25 (PERSONAL CARE ITEMS) ×3 IMPLANT
PAD PREP 24X41 OB/GYN DISP (PERSONAL CARE ITEMS) ×3 IMPLANT
SOL PREP PVP 2OZ (MISCELLANEOUS) ×3
SOLUTION PREP PVP 2OZ (MISCELLANEOUS) ×1 IMPLANT
SURGILUBE 2OZ TUBE FLIPTOP (MISCELLANEOUS) ×3 IMPLANT
TOWEL OR 17X26 4PK STRL BLUE (TOWEL DISPOSABLE) ×3 IMPLANT

## 2015-07-16 NOTE — Anesthesia Postprocedure Evaluation (Signed)
Anesthesia Post Note  Patient: Stacy Best  Procedure(s) Performed: Procedure(s) (LRB): ESSURE BILATERAL TUBAL STERILIZATION (Bilateral)  Patient location during evaluation: PACU Anesthesia Type: General Level of consciousness: awake Pain management: pain level controlled Vital Signs Assessment: post-procedure vital signs reviewed and stable Respiratory status: spontaneous breathing Cardiovascular status: blood pressure returned to baseline Anesthetic complications: no    Last Vitals:  Filed Vitals:   07/16/15 1816 07/16/15 1821  BP:    Pulse: 71 71  Temp:    Resp: 17 13    Last Pain:  Filed Vitals:   07/16/15 1821  PainSc: 7                  VAN STAVEREN,Aliscia Clayton

## 2015-07-16 NOTE — Anesthesia Procedure Notes (Signed)
Procedure Name: Intubation Date/Time: 07/16/2015 4:12 PM Performed by: Michaele OfferSAVAGE, Jamilynn Whitacre Pre-anesthesia Checklist: Patient identified, Emergency Drugs available, Suction available, Patient being monitored and Timeout performed Patient Re-evaluated:Patient Re-evaluated prior to inductionOxygen Delivery Method: Circle system utilized Preoxygenation: Pre-oxygenation with 100% oxygen Intubation Type: IV induction Ventilation: Mask ventilation without difficulty Laryngoscope Size: Mac and 3 Grade View: Grade I Tube type: Oral Tube size: 7.0 mm Number of attempts: 1 Airway Equipment and Method: Rigid stylet Placement Confirmation: ETT inserted through vocal cords under direct vision,  positive ETCO2 and breath sounds checked- equal and bilateral Secured at: 20 cm Tube secured with: Tape Dental Injury: Teeth and Oropharynx as per pre-operative assessment

## 2015-07-16 NOTE — Discharge Instructions (Signed)
Hysteroscopy, Care After °Refer to this sheet in the next few weeks. These instructions provide you with information on caring for yourself after your procedure. Your health care provider may also give you more specific instructions. Your treatment has been planned according to current medical practices, but problems sometimes occur. Call your health care provider if you have any problems or questions after your procedure.  °WHAT TO EXPECT AFTER THE PROCEDURE °After your procedure, it is typical to have the following: °· You may have some cramping. This normally lasts for a couple days. °· You may have bleeding. This can vary from light spotting for a few days to menstrual-like bleeding for 3-7 days. °HOME CARE INSTRUCTIONS °· Rest for the first 1-2 days after the procedure. °· Only take over-the-counter or prescription medicines as directed by your health care provider. Do not take aspirin. It can increase the chances of bleeding. °· Take showers instead of baths for 2 weeks or as directed by your health care provider. °· Do not drive for 24 hours or as directed. °· Do not drink alcohol while taking pain medicine. °· Do not use tampons, douche, or have sexual intercourse for 2 weeks or until your health care provider says it is okay. °· Take your temperature twice a day for 4-5 days. Write it down each time. °· Follow your health care provider's advice about diet, exercise, and lifting. °· If you develop constipation, you may: °¨ Take a mild laxative if your health care provider approves. °¨ Add bran foods to your diet. °¨ Drink enough fluids to keep your urine clear or pale yellow. °· Try to have someone with you or available to you for the first 24-48 hours, especially if you were given a general anesthetic. °· Follow up with your health care provider as directed. °SEEK MEDICAL CARE IF: °· You feel dizzy or lightheaded. °· You feel sick to your stomach (nauseous). °· You have abnormal vaginal discharge. °· You  have a rash. °· You have pain that is not controlled with medicine. °SEEK IMMEDIATE MEDICAL CARE IF: °· You have bleeding that is heavier than a normal menstrual period. °· You have a fever. °· You have increasing cramps or pain, not controlled with medicine. °· You have new belly (abdominal) pain. °· You pass out. °· You have pain in the tops of your shoulders (shoulder strap areas). °· You have shortness of breath. °  °This information is not intended to replace advice given to you by your health care provider. Make sure you discuss any questions you have with your health care provider. °  °Document Released: 01/19/2013 Document Reviewed: 01/19/2013 °Elsevier Interactive Patient Education ©2016 [employer] ° ° °AMBULATORY SURGERY  °DISCHARGE INSTRUCTIONS ° ° °1) The drugs that you were given will stay in your system until tomorrow so for the next 24 hours you should not: ° °A) Drive an automobile °B) Make any legal decisions °C) Drink any alcoholic beverage ° ° °2) You may resume regular meals tomorrow.  Today it is better to start with liquids and gradually work up to solid foods. ° °You may eat anything you prefer, but it is better to start with liquids, then soup and crackers, and gradually work up to solid foods. ° ° °3) Please notify your doctor immediately if you have any unusual bleeding, trouble breathing, redness and pain at the surgery site, drainage, fever, or pain not relieved by medication. ° ° ° °4) Additional Instructions: ° ° °Please contact your   physician with any problems or Same Day Surgery at [phone], Monday through Friday 6 am to 4 pm, or [facility] [place] Main number at [phone]. °

## 2015-07-16 NOTE — Anesthesia Preprocedure Evaluation (Addendum)
Anesthesia Evaluation  Patient identified by MRN, date of birth, ID band Patient awake    Reviewed: Allergy & Precautions, NPO status , Patient's Chart, lab work & pertinent test results, reviewed documented beta blocker date and time   Airway Mallampati: II  TM Distance: >3 FB     Dental  (+) Chipped   Pulmonary           Cardiovascular      Neuro/Psych  Headaches, PSYCHIATRIC DISORDERS Depression    GI/Hepatic   Endo/Other  diabetes, Type 2  Renal/GU      Musculoskeletal   Abdominal   Peds  Hematology   Anesthesia Other Findings L upper tooth broken. Pt states it has been bleeding in that region.  Reproductive/Obstetrics                            Anesthesia Physical Anesthesia Plan  ASA: II  Anesthesia Plan: General   Post-op Pain Management:    Induction: Intravenous  Airway Management Planned: Oral ETT  Additional Equipment:   Intra-op Plan:   Post-operative Plan:   Informed Consent: I have reviewed the patients History and Physical, chart, labs and discussed the procedure including the risks, benefits and alternatives for the proposed anesthesia with the patient or authorized representative who has indicated his/her understanding and acceptance.     Plan Discussed with: CRNA  Anesthesia Plan Comments:         Anesthesia Quick Evaluation

## 2015-07-16 NOTE — Transfer of Care (Signed)
Immediate Anesthesia Transfer of Care Note  Patient: Stacy Best  Procedure(s) Performed: Procedure(s): ESSURE BILATERAL TUBAL STERILIZATION (Bilateral)  Patient Location: PACU  Anesthesia Type:General  Level of Consciousness: awake, alert , oriented and patient cooperative  Airway & Oxygen Therapy: Patient Spontanous Breathing and Patient connected to face mask oxygen  Post-op Assessment: Report given to RN, Post -op Vital signs reviewed and stable and Patient moving all extremities X 4  Post vital signs: Reviewed and stable  Last Vitals:  Filed Vitals:   07/16/15 1414  BP: 104/84  Pulse: 63  Temp: 36.8 C  Resp: 16    Complications: No apparent anesthesia complications

## 2015-07-16 NOTE — H&P (Signed)
UPDATE TO PREVIOUS HISTORY AND PHYSICAL  The patient has been seen and examined.  H&P is up to date, no changes noted.  Patient is a 35 y.o. [phone] [id number] female who desires permanent sterilization via Essure procedure.  Risks and benefits have been explained. Other reversible forms of contraception have previously been discussed with patient; she declines all other modalities. Risks of procedure discussed with patient including but not limited to: risk of regret, permanence of method, bleeding, infection, injury to surrounding organs and need for additional procedures.  Failure risk of 1-2 % with increased risk of ectopic gestation if pregnancy occurs was also discussed with patient.  Patient verbalized understanding of these risks and wants to proceed with sterilization.  Also discussed possibility of laparoscopic procedure if Essure procedure unable to be completed, or if suspected perforation. Written informed consent obtained.  Patient can proceed to the OR for scheduled procedure.   Hildred LaserAnika Elwanda Moger, MD 07/16/2015 1:59 PM

## 2015-07-16 NOTE — Op Note (Signed)
Procedure(s): ESSURE BILATERAL TUBAL STERILIZATION Procedure Note  Stacy BlackbirdLorena Best female 35 y.o. 07/16/2015  Indications: The patient is a 35 y.o. [phone] female who desires permanent sterilization with Essure procedure.   Pre-operative Diagnosis: Multiparity, desires permanent sterilization  Post-operative Diagnosis:  Same  Surgeon: Hildred LaserAnika Ambrose Wile, MD  Assistants: None  Anesthesia: General endotracheal anesthesia  ASA Class: II  Findings: The uterus was sounded to 8 cm.   The tubal ostia were identified bilaterally.  Essure coils visible from ostia bilaterally at conclusion of procedure.  Weakly proliferative endometrium.    Procedure Details: The patient was seen in the Holding Room. The risks, benefits, complications, treatment options, and expected outcomes were discussed with the patient.  The patient concurred with the proposed plan, giving informed consent.  The site of surgery properly noted/marked. The patient was taken to the Operating Room, identified as Stacy Best and the procedure verified as Procedure(s) (LRB): ESSURE BILATERAL TUBAL STERILIZATION (Bilateral). A Time Out was held and the above information confirmed.  She was then placed under general anesthesia without difficulty. She was placed in the dorsal lithotomy position, using Allen stirrups, and was prepped and draped in a sterile manner.  A straight catheterization was performed. A sterile speculum was inserted into the vagina and the cervix was grasped at the anterior lip using a single-toothed tenaculum.  The uterus was sounded to 8 cm and then dilated appropriately.   A 12 mm hysteroscope was introduced into the uterus under direct visualization. The cavity was allowed to fill, and then the entire cavity was explored with the findings described above. The Essure coil was introduced into the left tubal ostium and device deployed without difficulty.  A second Essure coil was introduced into what was  thought to be the right tubal ostia, however after deployment, was noted to be a false track.  The coil was recovered using hysteroscopic graspers, and removed from the uterine cavity.  The true right ostium was identified and a third Essure coil was introduced into the right tubal ostium without and deployed without difficulty.  3-4 coils noted on right fallopian tube ostia, 6-8 coils noted from the left fallopian tube ostia. The tenaculum was removed and excellent hemostasis was noted. Speculum removed from the vagina.  The patient tolerated the procedure well.  She was awakened from anesthesia and taken to the recovery room in stable condition.   All sponge and instrument counts were correct x 2 at the end of the procedure.    Estimated Blood Loss:  10 ml      Drains: straight catheterization prior to procedure with  100 ml of clear urine         Total IV Fluids:  650 ml  Specimens: None         Implants: Essure tubal coils bilaterally in tubal ostia         Complications:  None; patient tolerated the procedure well.         Disposition: PACU - hemodynamically stable.         Condition: stable   Hildred LaserAnika Bailie Christenbury, MD [facility]

## 2015-07-17 ENCOUNTER — Encounter: Payer: Self-pay | Admitting: Obstetrics and Gynecology

## 2017-07-02 ENCOUNTER — Ambulatory Visit
Admission: RE | Admit: 2017-07-02 | Discharge: 2017-07-02 | Disposition: A | Discharge status: Home / Self Care | Payer: Self-pay | Source: Ambulatory Visit | Attending: Family Medicine | Admitting: Family Medicine

## 2017-07-02 ENCOUNTER — Other Ambulatory Visit (HOSPITAL_COMMUNITY): Payer: Self-pay | Admitting: Family Medicine

## 2017-07-02 DIAGNOSIS — R7611 Nonspecific reaction to tuberculin skin test without active tuberculosis: Secondary | ICD-10-CM

## 2017-08-06 ENCOUNTER — Encounter
Admit: 2017-08-06 | Discharge: 2017-08-07 | Payer: MEDICARE | Attending: Obstetrics & Gynecology | Primary: Obstetrics & Gynecology

## 2017-08-06 DIAGNOSIS — R319 Hematuria, unspecified: Secondary | ICD-10-CM

## 2017-08-06 DIAGNOSIS — R102 Pelvic and perineal pain: Principal | ICD-10-CM

## 2017-08-06 DIAGNOSIS — R1030 Lower abdominal pain, unspecified: Secondary | ICD-10-CM

## 2017-12-04 IMAGING — US US MFM OB DETAIL+14 WK
1 series · 14 of 28 positions shown · non-contrast
Comparison: none

[Series 1: us mfm ob detail+14 wk · 0.23mm/px · 46 acquisitions, 14 frames shown]
[im 2/46]
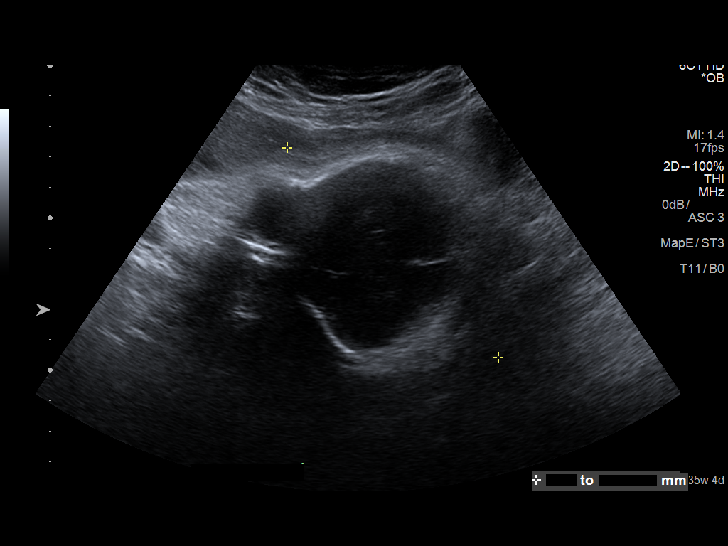
[im 6/46]
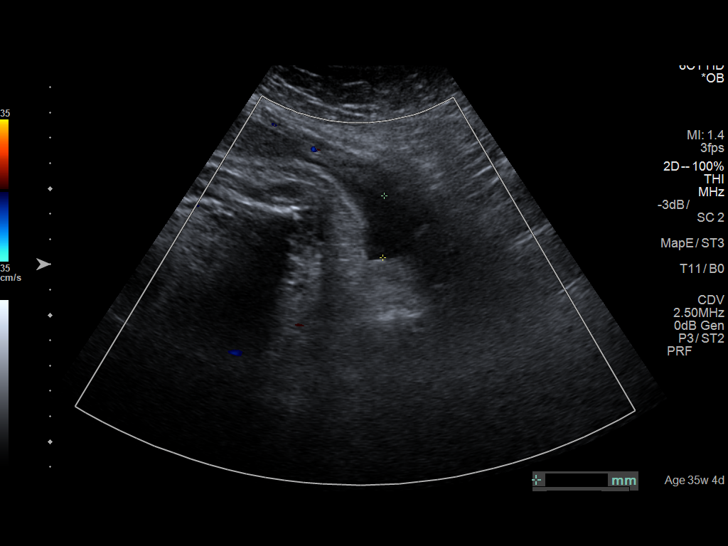
[im 9/46]
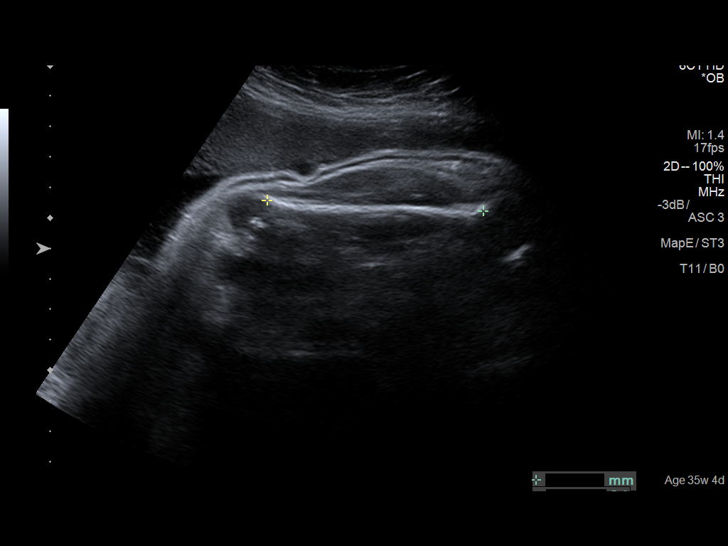
[im 12/46]
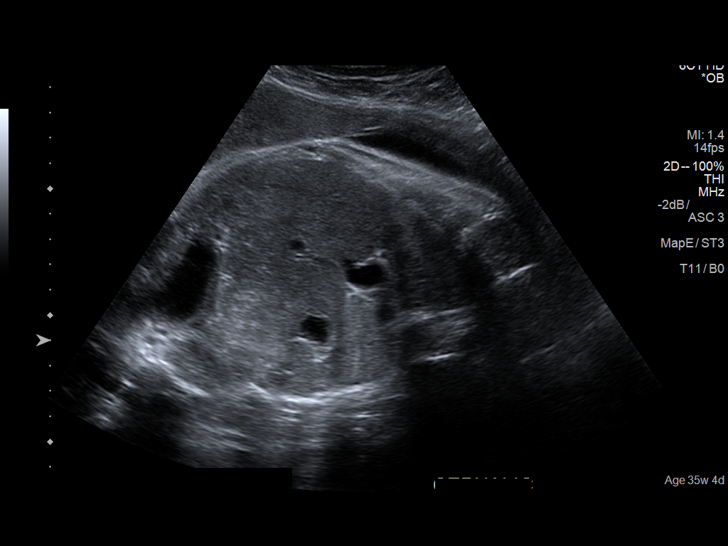
[im 16/46]
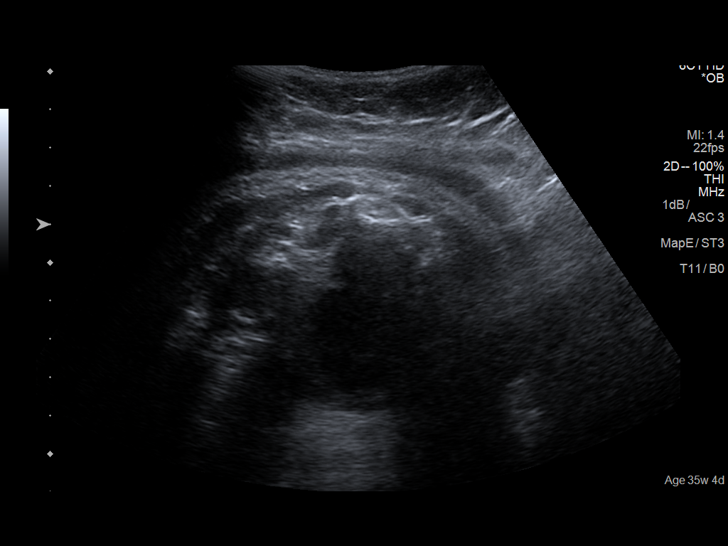
[im 19/46]
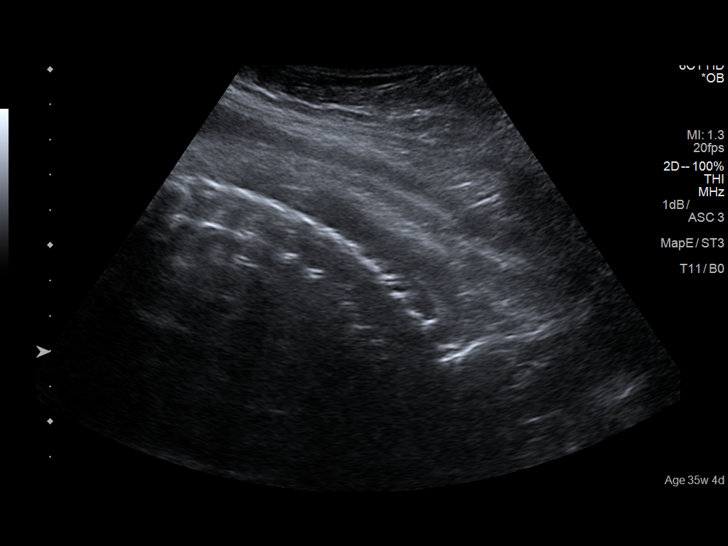
[im 22/46]
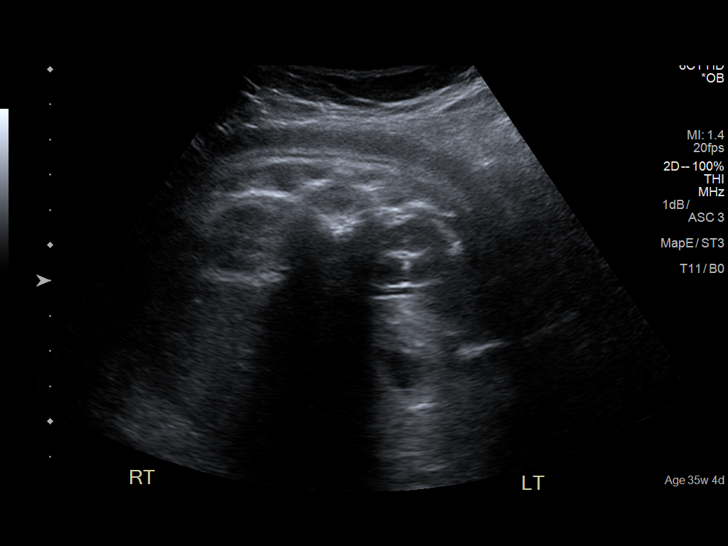
[im 26/46]
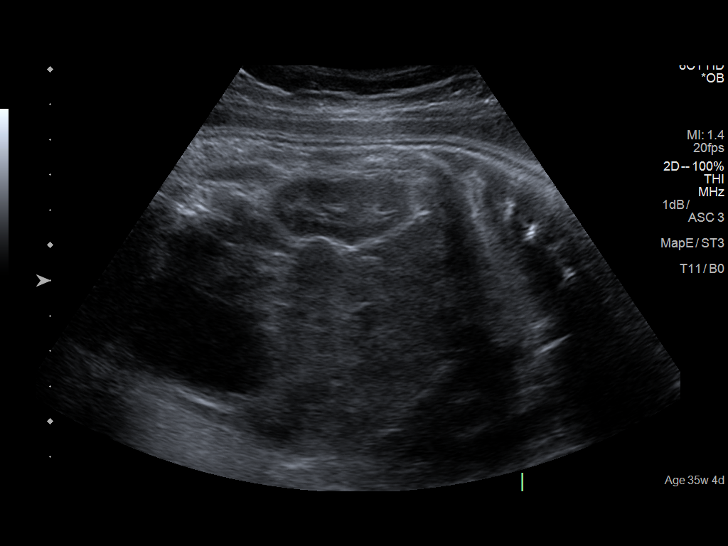
[im 29/46]
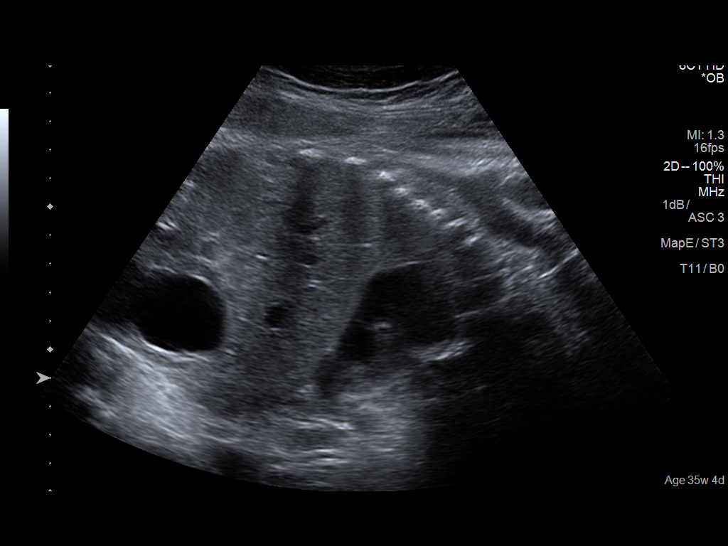
[im 32/46]
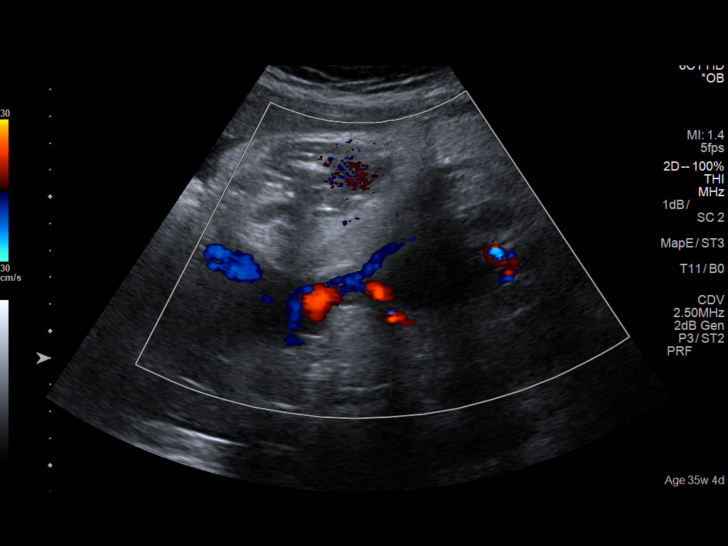
[im 36/46]
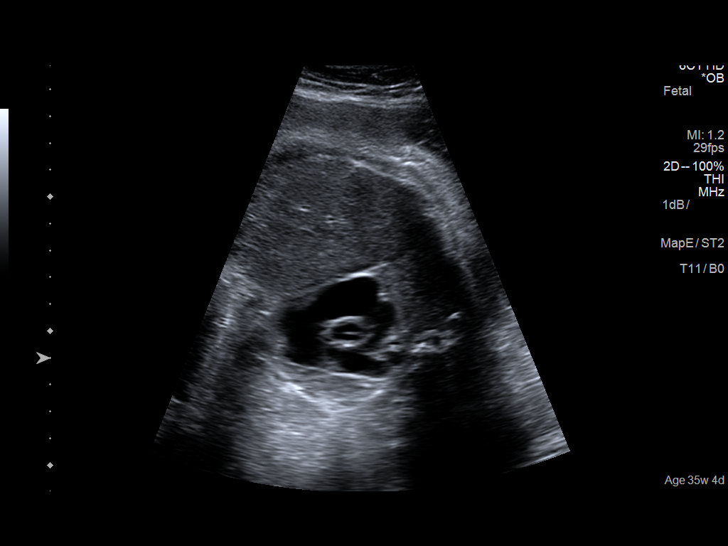
[im 39/46]
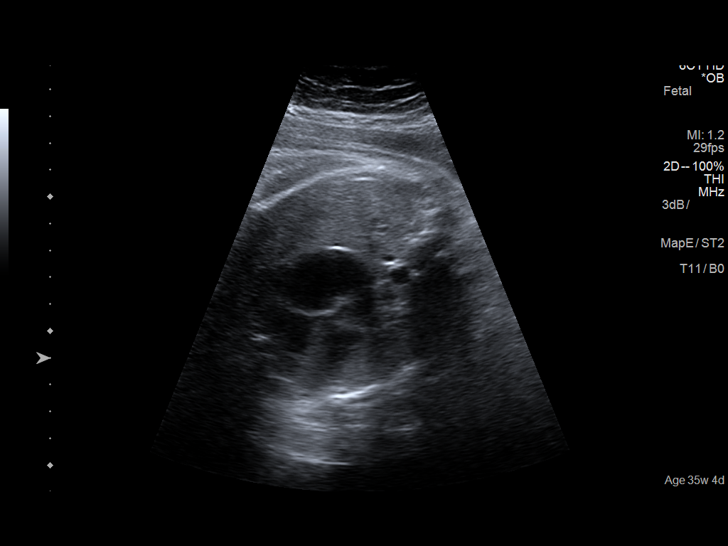
[im 42/46]
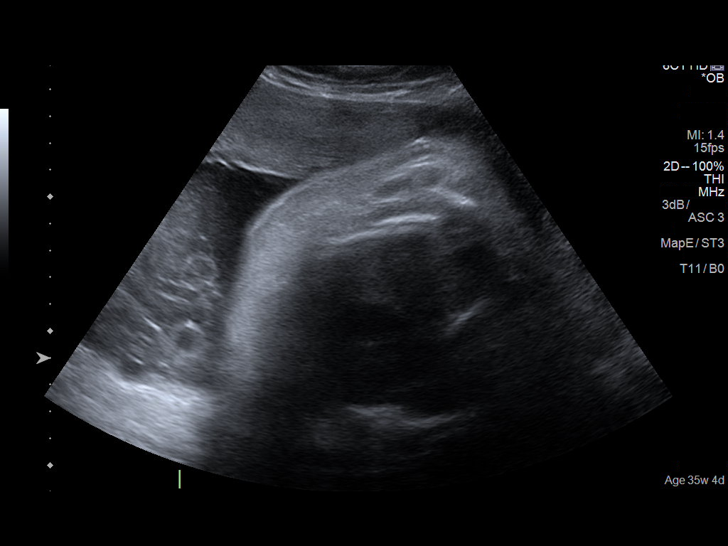
[im 46/46]
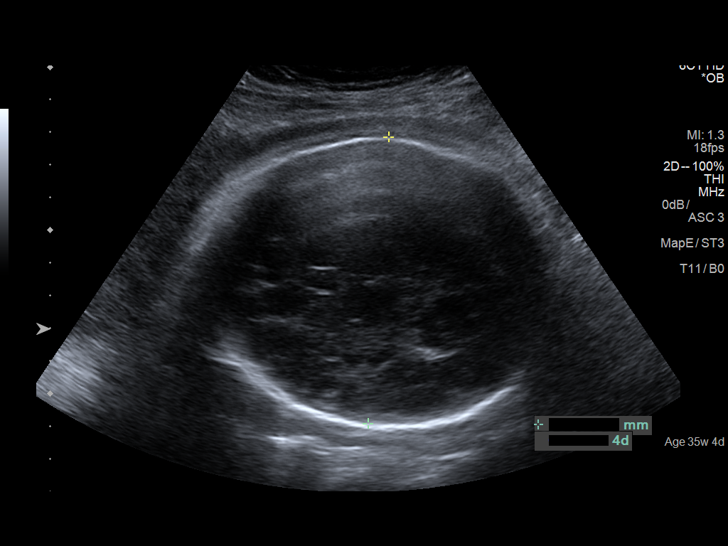

[14 of 28 positions shown; findings below may reference images not displayed]

Canned report from images found in remote index.

Refer to host system for actual result text.

## 2018-08-12 IMAGING — CR DG CHEST 1V
1 series · 1 of 1 positions shown · non-contrast
Comparison: None.

CLINICAL DATA: Positive tuberculin skin test

EXAM:
CHEST  1 VIEW

[w chest pa]
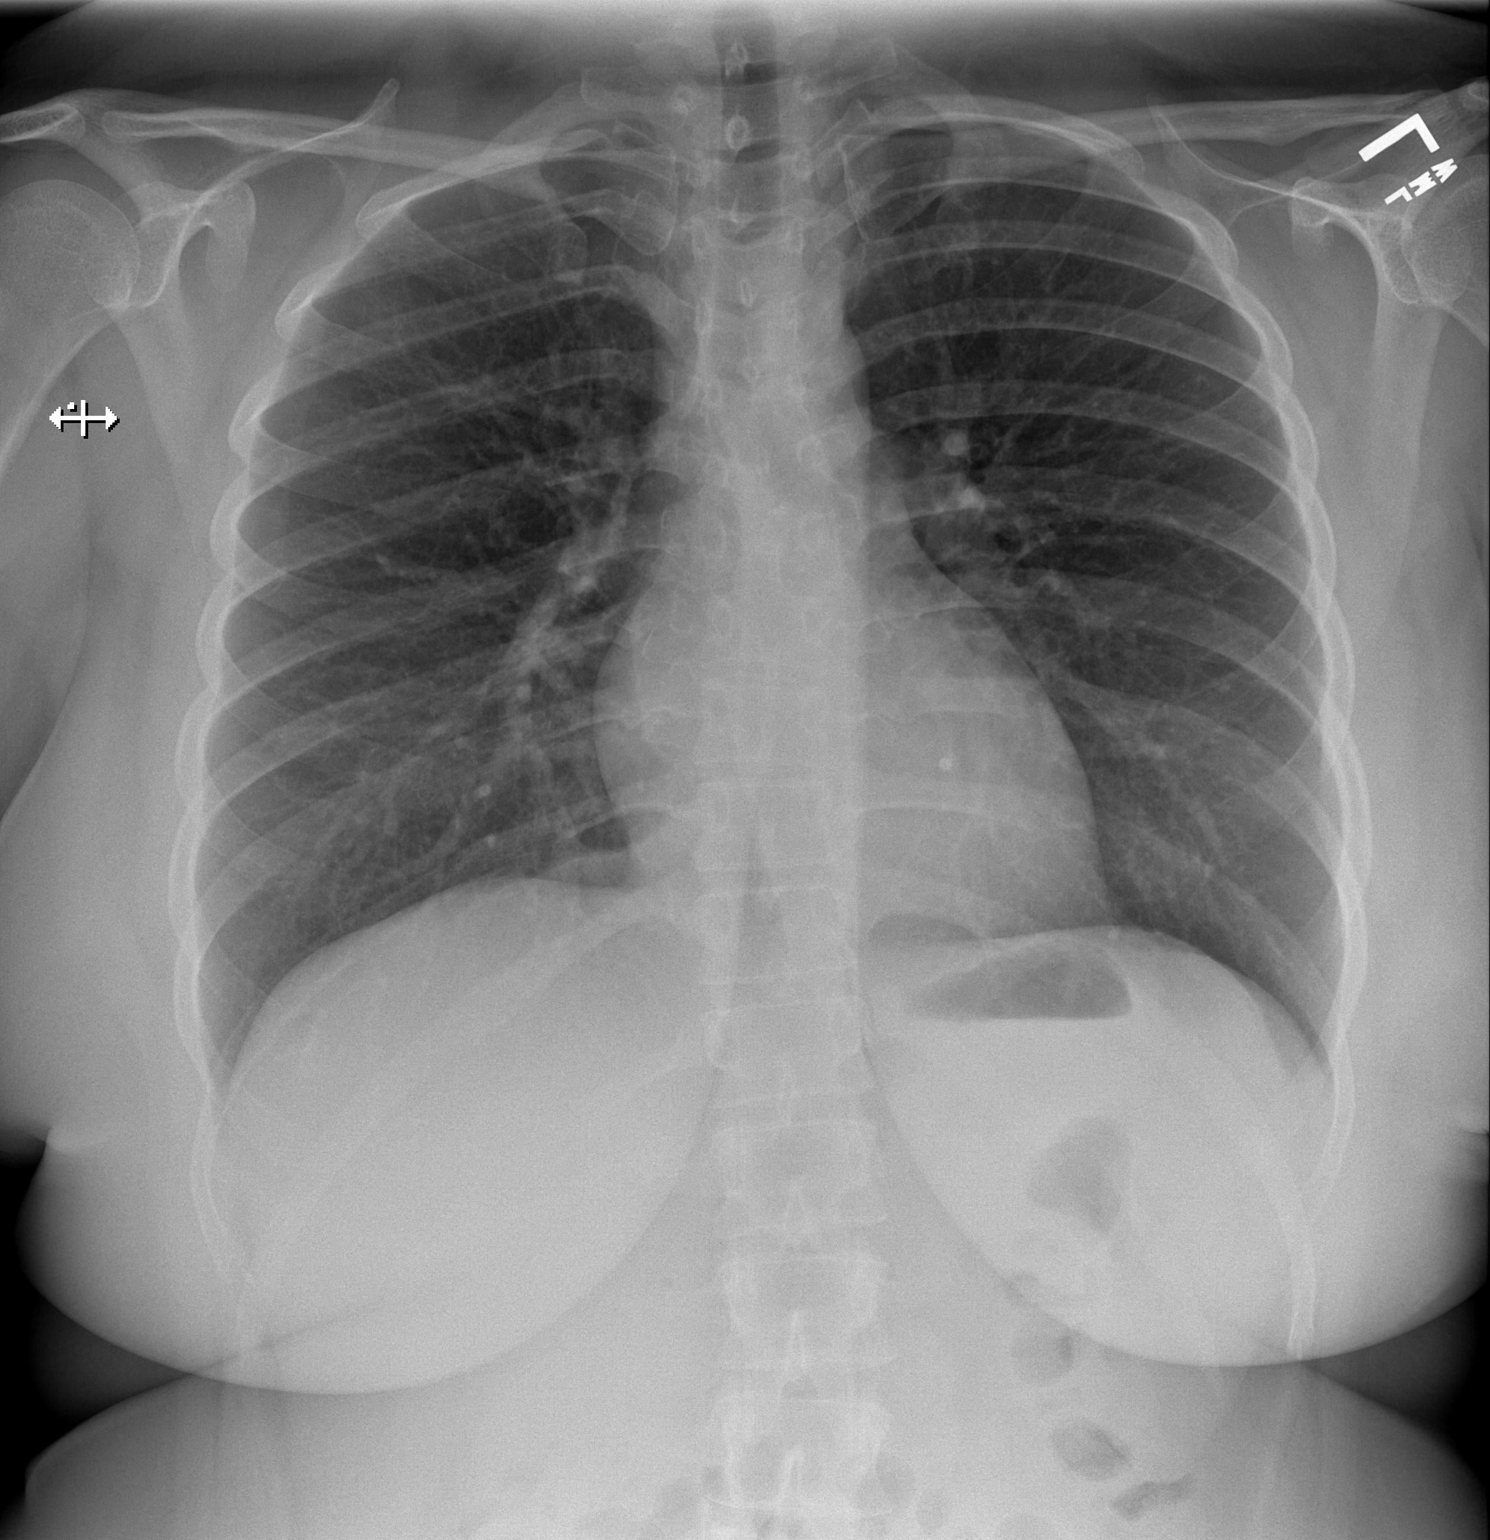

[1 of 1 positions shown; findings below may reference images not displayed]

FINDINGS: Lungs are clear. Heart size and pulmonary vascularity are normal. No
adenopathy. No bone lesions.
IMPRESSION: No abnormality noted.

## 2020-01-04 ENCOUNTER — Ambulatory Visit: Admit: 2020-01-04 | Discharge: 2020-01-05 | Disposition: A | Discharge status: Home / Self Care | Payer: MEDICARE

## 2020-01-04 ENCOUNTER — Encounter: Admit: 2020-01-04 | Discharge: 2020-01-05 | Disposition: A | Discharge status: Home / Self Care | Payer: MEDICARE

## 2020-01-04 DIAGNOSIS — L03031 Cellulitis of right toe: Principal | ICD-10-CM

## 2020-01-04 MED ORDER — OXYCODONE 5 MG TABLET
ORAL_TABLET | ORAL | 0 refills | 2.00000 days | Status: CP | PRN
Start: 2020-01-04 — End: 2020-01-09

## 2020-01-04 MED ORDER — SULFAMETHOXAZOLE 800 MG-TRIMETHOPRIM 160 MG TABLET
ORAL_TABLET | Freq: Two times a day (BID) | ORAL | 0 refills | 10 days | Status: CP
Start: 2020-01-04 — End: 2020-01-14

## 2020-05-07 ENCOUNTER — Ambulatory Visit: Admit: 2020-05-07 | Discharge: 2020-05-08

## 2020-05-07 DIAGNOSIS — L400 Psoriasis vulgaris: Principal | ICD-10-CM

## 2020-05-07 MED ORDER — FLUOCINOLONE 0.01 % SCALP OIL AND SHOWER CAP
TOPICAL | 3 refills | 0.00000 days | Status: CP
Start: 2020-05-07 — End: 2020-05-07

## 2020-05-07 MED ORDER — APREMILAST 30 MG TABLET
ORAL_TABLET | Freq: Two times a day (BID) | ORAL | 2 refills | 30.00000 days | Status: CP
Start: 2020-05-07 — End: 2020-08-05

## 2020-05-07 MED ORDER — CLOBETASOL 0.05 % TOPICAL OINTMENT
Freq: Two times a day (BID) | TOPICAL | 5 refills | 0.00000 days | Status: CP
Start: 2020-05-07 — End: 2021-05-07

## 2020-05-07 MED ORDER — APREMILAST 10 MG (4)-20 MG (4)-30 MG (47) TABLETS IN A DOSE PACK
ORAL_TABLET | ORAL | 0 refills | 0.00000 days | Status: CP
Start: 2020-05-07 — End: ?

## 2020-05-08 DIAGNOSIS — L400 Psoriasis vulgaris: Principal | ICD-10-CM

## 2020-07-02 ENCOUNTER — Ambulatory Visit: Admit: 2020-07-02 | Discharge: 2020-07-03

## 2020-07-02 DIAGNOSIS — L400 Psoriasis vulgaris: Principal | ICD-10-CM

## 2020-07-02 MED ORDER — FLUOCINOLONE 0.01 % SCALP OIL AND SHOWER CAP
TOPICAL | 5 refills | 0.00000 days | Status: CP
Start: 2020-07-02 — End: ?

## 2020-07-02 MED ORDER — CLOBETASOL 0.05 % TOPICAL OINTMENT
Freq: Two times a day (BID) | TOPICAL | 5 refills | 0.00000 days | Status: CP
Start: 2020-07-02 — End: 2021-07-02

## 2020-07-18 DIAGNOSIS — Z1231 Encounter for screening mammogram for malignant neoplasm of breast: Principal | ICD-10-CM

## 2020-07-30 ENCOUNTER — Ambulatory Visit: Admit: 2020-07-30 | Discharge: 2020-07-31

## 2020-08-01 ENCOUNTER — Telehealth: Payer: Self-pay

## 2020-08-01 DIAGNOSIS — Z227 Latent tuberculosis: Secondary | ICD-10-CM | POA: Insufficient documentation

## 2020-08-01 NOTE — Telephone Encounter (Signed)
Returning call to patient. 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 Info re: Rifampin emailed to the patient. Explained that there is a supply issue with Rifampin at this time and TB RN can call patient once this has resolved.    Before proceeding with LTBI tx, patient will need f/u CXR and EPI update including baseline labs. Richmond Campbell, RN

## 2020-10-05 ENCOUNTER — Ambulatory Visit: Admit: 2020-10-05 | Discharge: 2020-10-06

## 2020-10-05 DIAGNOSIS — Z79899 Other long term (current) drug therapy: Principal | ICD-10-CM

## 2020-10-05 DIAGNOSIS — L400 Psoriasis vulgaris: Principal | ICD-10-CM

## 2020-10-05 MED ORDER — CLOBETASOL 0.05 % SCALP SOLUTION
Freq: Two times a day (BID) | TOPICAL | 5 refills | 0.00000 days | Status: CP
Start: 2020-10-05 — End: 2021-10-05

## 2020-10-05 MED ORDER — HALOBETASOL PROPIONATE 0.05 % TOPICAL OINTMENT
Freq: Two times a day (BID) | TOPICAL | 5 refills | 0.00000 days | Status: CP
Start: 2020-10-05 — End: 2021-10-05

## 2020-10-05 MED ORDER — STELARA 45 MG/0.5 ML SUBCUTANEOUS SYRINGE
SUBCUTANEOUS | 0 refills | 0.00000 days | Status: CP
Start: 2020-10-05 — End: ?
  Filled 2020-11-06: qty 0.5, 28d supply, fill #0

## 2020-10-08 ENCOUNTER — Telehealth: Payer: Self-pay

## 2020-10-08 DIAGNOSIS — L400 Psoriasis vulgaris: Principal | ICD-10-CM

## 2020-10-08 NOTE — Telephone Encounter (Signed)
Attempted TC back to patient.  LMTC TB RN Richmond Campbell, RN

## 2020-10-08 NOTE — Telephone Encounter (Signed)
TC from patient.  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 Dyke Brackett didn't work so she may begin injections for tx.  Patient states that she did find documentation proving that she has taken TB meds in the past but her dr would like her to have another CXR.    Disussed Rifampin shortage and that the only option would be INH at this time.  Also informed her that since she has already had tx we would not re-tx and couldn't do another CXR.  Educated that yearly CXR are not the states recommendations, and that her Dr should do a TBS q6-12 months.   Instructed to call TB RN if she had any other concerns. Richmond Campbell, RN

## 2020-10-24 NOTE — Unmapped (Signed)
[facility] Specialty Medication Onboarding    Specialty Medication: Stelara 45mg /0.67ml syringe  Prior Authorization: Not Required   Financial Assistance: Yes - manufacture assistance approved via copay card or RX [employer] (1102 bucket) as primary   Final Copay/Day Supply: $0 / 28 load and $0/ 84 maintenance    Insurance Restrictions: can fill 1 syringe at a time    Notes to Pharmacist:     The triage team has completed the benefits investigation and has determined that the patient is able to fill this medication at [facility]. Please contact the patient to complete the onboarding or follow up with the prescribing physician as needed.

## 2020-10-29 NOTE — Unmapped (Signed)
*in addition to reviewing the information below, I also sent a link to an injection training video to the patient's email: [email or web address].     [facility]   Patient Onboarding/Medication Counseling    Kelsey Nguyen is a 40 y.o. female with psoriasis who I am counseling today on initiation of therapy.  I am speaking to the patient.    Was a Nurse, learning disability used for this call? No    Verified patient's date of birth / HIPAA.    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 Tuberculosis: Negative Chest XR documented in patient's chart.- per notes from derm.    Administration:     Prefilled syringe  1. Gather all supplies needed for injection on a clean, flat working surface: medication syringe(s) removed from packaging, alcohol swab, sharps container, etc.  2. Look at the medication label - look for correct medication, correct dose, and check the expiration date  3. Look at the medication - the liquid in the syringe should appear clear and colorless to slightly yellow, you may see a few white particles  4. Lay the syringe on a flat surface and allow it to warm up to room temperature for at least 15-30 minutes  5. Select injection site - you can use the front of your thigh or your belly (but not the area 2 inches around your belly button); if someone else is giving you the injection you can also use your upper arm in the skin covering your triceps muscle or in the buttocks  6. Prepare injection site - wash your hands and clean the skin at the injection site with an alcohol swab and let it air dry, do not touch the injection site again before the injection  7. Pull off the needle safety cap, do not remove until immediately prior to injection  8. Pinch the skin - with your hand not holding the syringe pinch up a fold of skin at the injection site using your forefinger and thumb  9. Insert the needle into the fold of skin at about a 45 degree angle - it's best to use a quick dart-like motion  10. Push the plunger down slowly as far as it will go until the syringe is empty, if the plunger is not fully depressed the needle shield will not extend to cover the needle when it is removed, hold the syringe in place for a full 5 seconds  11. Check that the syringe is empty and keep pressing down on the plunger while you pull the needle out at the same angle as inserted; after the needle is removed completely from the skin, release the plunger allowing the needle shield to activate and cover the used needle  12. Dispose of the used syringe immediately in your sharps disposal container, do not attempt to recap the needle prior to disposing  13. If you see any blood at the injection site, press a cotton ball or gauze on the site and maintain pressure until the bleeding stops, do not rub the  injection site      Adherence/Missed dose instructions:  If your injection is given more than 7-10 days after your scheduled injection date - consult your pharmacist for additional instructions on how to adjust your dosing schedule.    Goals of Therapy     Plaque Psoriasis  ??? Minimize areas of skin involvement (% BSA)  ??? Avoidance of long term glucocorticoid use  ??? Maintenance of effective psychosocial functioning      Side Effects & Monitoring Parameters     ??? Injection site reaction (redness, irritation, inflammation localized to the site of administration)  ??? Signs of a common cold - minor sore throat, runny or stuffy nose, etc.  ??? Feeling tired or weak  ??? Headache    The following side effects should be reported to the provider:  ??? Signs of a hypersensitivity reaction - rash; hives; itching; red, swollen, blistered, or peeling skin; wheezing; tightness in the chest or throat; difficulty breathing, swallowing, or talking; swelling of the mouth, face, lips, tongue, or throat; etc.  ??? Reduced immune function - report signs of infection such as fever; chills; body aches; very bad sore throat; ear or sinus pain; cough; more sputum or change in color of sputum; pain with passing urine; wound that will not heal, etc.  Also at a slightly higher risk of some malignancies (mainly skin and blood cancers) due to this reduced immune function.  o In the case of signs of infection - the patient should hold the next dose of Stelara?? and call your primary care provider to ensure adequate medical care.  Treatment may be resumed when infection is treated and patient is asymptomatic.  ??? Changes in skin - a new growth or lump that forms; changes in shape, size, or color of a previous mole or marking  ??? Shortness of breath or chest pain  ??? Vaginal itching or discharge      Contraindications, Warnings, & Precautions     ??? Have your bloodwork checked as you have been told by your prescriber  ??? Talk with your doctor if you are pregnant, planning to become pregnant, or breastfeeding  ??? Discuss the possible need for holding your dose(s) of Stelara?? when a planned procedure is scheduled with the prescriber as it may delay healing/recovery timeline       Drug/Food Interactions     ??? Medication list reviewed in Epic. The patient was instructed to inform the care team before taking any new medications or supplements. No drug interactions identified.   ??? If you have a latex allergy use caution when handling, the needle cap of the Stelara?? prefilled syringe contains a derivative of natural rubber latex  ??? Talk with you prescriber or pharmacist before receiving any live vaccinations while taking this medication and after you stop taking it    Storage, Handling Precautions, & Disposal     ??? Store this medication in the refrigerator.  Do not freeze  ??? If needed, you may store at room temperature for up to 30 days  ??? Store in original packaging, protected from light  ??? Do not shake  ??? Dispose of used syringes/pens in a sharps disposal container            Current Medications (including OTC/herbals), Comorbidities and Allergies     Current Outpatient Medications   Medication Sig Dispense Refill   ??? apremilast 10 mg (4)-20 mg (4)-30 mg (47) DsPk Take as directed on starter pack (Patient not taking: Reported on  10/04/2020) 55 tablet 0   ??? clobetasoL (TEMOVATE) 0.05 % external solution Apply topically Two (2) times a day. 50 mL 5   ??? clobetasoL (TEMOVATE) 0.05 % ointment Apply topically Two (2) times a day. Apply the medication twice daily to thick, scaly, or red areas on the skin until smooth. Then stop and re-start as soon as the skin changes come back. 60 g 5   ??? famotidine (PEPCID) 20 MG tablet Take 1 tablet (20 mg total) by mouth Two (2) times a day. 30 tablet 0   ??? fluocinolone (DERMA-SMOOTHE) 0.01 % external oil Apply and leave on scalp once or twice daily 120 mL 5   ??? halobetasol (ULTRAVATE) 0.05 % ointment Apply topically Two (2) times a day. To psoriasis. 50 g 5   ??? norgestimate-ethinyl estradiol (ORTHO-CYCLEN) 0.25-35 mg-mcg per tablet Take 1 tablet by mouth daily. 84 tablet 11   ??? ustekinumab (STELARA) 45 mg/0.5 mL Syrg syringe Inject the contents of 1 syringe (45 mg) under the skin at 0 and 4 weeks, and THEN 1 syringe (45mg ) every 12 weeks thereafter. 1 mL 0   ??? ustekinumab (STELARA) 45 mg/0.5 mL Syrg syringe Inject the contents of 1 syringe (0.5 mL) under the skin every 12 weeks. 0.5 mL 3     No current facility-administered medications for this visit.       Allergies   Allergen Reactions   ??? Cephalexin Hives and Rash   ??? Nitrofurantoin Monohyd/M-Cryst Hives     Other reaction(s): anaphylaxis       Patient Active Problem List   Diagnosis   ??? Supervision of normal pregnancy   ??? Allergic reaction   ??? Anaphylaxis   ??? Post-operative state   ??? Lower abdominal pain   ??? Hematuria   ??? Cellulitis of toe of right foot       Reviewed and up to date in Epic.    Appropriateness of Therapy     Acute infections noted within Epic:  No active infections  Patient reported infection: None    Is medication and dose appropriate based on diagnosis and infection status? Yes    Prescription has been clinically reviewed: Yes      Baseline Quality of Life Assessment      How many days over the past month did your psoriasis  keep you from your normal activities? For example, brushing your teeth or getting up in the morning. 0    Financial Information     Medication Assistance provided: Manufacturer Assistance    Anticipated copay of $0 reviewed with patient. Verified delivery address.    Delivery Information     Scheduled delivery date: Tues, 7/26     Expected start date: Tues, 7/26    Medication will be delivered via Same Day Courier to the prescription address in [facility].  This shipment will not require a signature.      Explained the services we provide at [facility] and that each month we would call to set up refills.  Stressed importance of returning phone calls so that we could ensure they receive their medications in time each month.  Informed patient that we should be setting up refills 7-10 days prior to when they will run out of medication.  A pharmacist will reach out to perform a clinical assessment periodically.  Informed patient that a welcome packet, containing information about our pharmacy and other support services, a Notice of Privacy Practices, and a drug information handout  will be sent.      The patient or caregiver noted above participated in the development of this care plan and knows that they can request review of or adjustments to the care plan at any time.      Patient or caregiver verbalized understanding of the above information as well as how to contact the pharmacy at [phone] option 4 with any questions/concerns.  The pharmacy is open Monday through Friday 8:30am-4:30pm.  A pharmacist is available 24/7 via pager to answer any clinical questions they may have.    Patient Specific Needs     - Does the patient have any physical, cognitive, or cultural barriers? No    - Does the patient have adequate living arrangements? (i.e. the ability to store and take their medication appropriately) Yes    - Did you identify any home environmental safety or security hazards? No    - Patient prefers to have medications discussed with  Patient     - Is the patient or caregiver able to read and understand education materials at a high school level or above? Yes    - Patient's primary language is  English     - Is the patient high risk? No    - Does the patient require physician intervention or other additional services (i.e. dietary/nutrition, smoking cessation, social work)? No      Kelsey Nguyen A Desiree Lucy [facility] Specialty Pharmacist

## 2020-10-31 DIAGNOSIS — R7611 Nonspecific reaction to tuberculin skin test without active tuberculosis: Principal | ICD-10-CM

## 2020-11-02 ENCOUNTER — Ambulatory Visit: Admit: 2020-11-02 | Discharge: 2020-11-02

## 2020-11-05 MED ORDER — EMPTY CONTAINER
2 refills | 0 days
Start: 2020-11-05 — End: ?

## 2020-11-06 MED FILL — EMPTY CONTAINER: 120 days supply | Qty: 1 | Fill #0

## 2020-11-06 NOTE — Unmapped (Signed)
[facility] lab results and chest xray for review

## 2020-12-03 NOTE — Unmapped (Signed)
Kelsey Nguyen has seen a lot of improvement from just 1 Stelara dose.     She has been having some mild constipation (she reports not very bothersome) and plantar fasciitis. We reviewed that NSAIDs, compression socks, and rest can help her foot pain. Additionally, she has some new lesions on the back part of her heel. She has reached out to clinic for input, but she reports she's never had psoriasis here. I recommended she try Vaseline/extra socks/bandaids to help cushion the area since it seems like it may have come from rubbing in a shoe. I recommended she follow up with clinic if they grow/spread.     Second dose due 8/28, then 11/20 for first maintenance.       [facility] Clinical Assessment & Refill Coordination Note    Kelsey Nguyen, DOB: [birth year]  Phone: [phone] (home)     All above HIPAA information was verified with patient.     Was a Nurse, learning disability used for this call? No    Specialty Medication(s):   Inflammatory Disorders: Stelara     Current Outpatient Medications   Medication Sig Dispense Refill   ??? apremilast 10 mg (4)-20 mg (4)-30 mg (47) DsPk Take as directed on starter pack (Patient not taking: Reported on 10/04/2020) 55 tablet 0   ??? clobetasoL (TEMOVATE) 0.05 % external solution Apply topically Two (2) times a day. 50 mL 5   ??? clobetasoL (TEMOVATE) 0.05 % ointment Apply topically Two (2) times a day. Apply the medication twice daily to thick, scaly, or red areas on the skin until smooth. Then stop and re-start as soon as the skin changes come back. 60 g 5   ??? empty container Misc Use as directed to dispose of Stelara syringes. 1 each 2   ??? famotidine (PEPCID) 20 MG tablet Take 1 tablet (20 mg total) by mouth Two (2) times a day. 30 tablet 0   ??? fluocinolone (DERMA-SMOOTHE) 0.01 % external oil Apply and leave on scalp once or twice daily 120 mL 5   ??? halobetasol (ULTRAVATE) 0.05 % ointment Apply topically Two (2) times a day. To psoriasis. 50 g 5   ??? norgestimate-ethinyl estradiol (ORTHO-CYCLEN) 0.25-35 mg-mcg per tablet Take 1 tablet by mouth daily. 84 tablet 11   ??? ustekinumab (STELARA) 45 mg/0.5 mL Syrg syringe Inject the contents of 1 syringe (45 mg) under the skin at 0 and 4 weeks, and THEN 1 syringe (45mg ) every 12 weeks thereafter. 1 mL 0   ??? ustekinumab (STELARA) 45 mg/0.5 mL Syrg syringe Inject the contents of 1 syringe (0.5 mL) under the skin every 12 weeks. 0.5 mL 3     No current facility-administered medications for this visit.        Changes to medications: Kelsey Nguyen reports no changes at this time.    Allergies   Allergen Reactions   ??? Cephalexin Hives and Rash   ??? Nitrofurantoin Monohyd/M-Cryst Hives     Other reaction(s): anaphylaxis       Changes to allergies: No    SPECIALTY MEDICATION ADHERENCE     Stelara - 0 left  Medication Adherence    Patient reported X missed doses in the last month: 0  Specialty Medication: Stelara        Specialty medication(s) dose(s) confirmed: Regimen is correct and unchanged.     Are there any concerns with adherence? No    Adherence counseling provided? Not needed    CLINICAL MANAGEMENT AND INTERVENTION  Clinical Benefit Assessment:    Do you feel the medicine is effective or helping your condition? Yes    Clinical Benefit counseling provided? Not needed    Adverse Effects Assessment:    Are you experiencing any side effects? No    Are you experiencing difficulty administering your medicine? No    Quality of Life Assessment:    Quality of Life    Rheumatology  Oncology  Dermatology  1. What impact has your specialty medication had on the symptoms of your skin condition (i.e. itchiness, soreness, stinging)?: Tremendous  2. What impact has your specialty medication had on your comfort level with your skin?: Tremendous  Cystic Fibrosis            Have you discussed this with your provider? Not needed    Acute Infection Status:    Acute infections noted within Epic:  No active infections  Patient reported infection: None    Therapy Appropriateness:    Is therapy appropriate? Yes, therapy is appropriate and should be continued    DISEASE/MEDICATION-SPECIFIC INFORMATION      For patients on injectable medications: Patient currently has 0 doses left.  Next injection is scheduled for 8/28.    PATIENT SPECIFIC NEEDS     - Does the patient have any physical, cognitive, or cultural barriers? No    - Is the patient high risk? No    - Does the patient require a Care Management Plan? No     - Does the patient require physician intervention or other additional services (i.e. nutrition, smoking cessation, social work)? No      SHIPPING     Specialty Medication(s) to be Shipped:   Inflammatory Disorders: Stelara    Other medication(s) to be shipped: No additional medications requested for fill at this time     Changes to insurance: No    Delivery Scheduled: Yes, Expected medication delivery date: Tues, 8/23.     Medication will be delivered via Same Day Courier to the confirmed prescription address in [facility].    The patient will receive a drug information handout for each medication shipped and additional FDA Medication Guides as required.  Verified that patient has previously received a Conservation officer, historic buildings and a Surveyor, mining.    The patient or caregiver noted above participated in the development of this care plan and knows that they can request review of or adjustments to the care plan at any time.      All of the patient's questions and concerns have been addressed.    Lanney Gins   [facility] Specialty Pharmacist

## 2020-12-04 MED FILL — STELARA 45 MG/0.5 ML SUBCUTANEOUS SYRINGE: 84 days supply | Qty: 0.5 | Fill #1

## 2020-12-04 NOTE — Unmapped (Signed)
Appointment on 10/03 with Dr Dub Mikes    Thanks!

## 2021-01-14 ENCOUNTER — Ambulatory Visit: Admit: 2021-01-14 | Discharge: 2021-01-15

## 2021-01-14 MED ORDER — CLOBETASOL 0.05 % TOPICAL OINTMENT
Freq: Two times a day (BID) | TOPICAL | 3 refills | 0.00000 days | Status: CP
Start: 2021-01-14 — End: 2022-01-14

## 2021-01-14 NOTE — Unmapped (Addendum)
Continue stelara  Use clobetasol ointment 2 times as needed for your psoriasis and itchy rash  Use a tar shampoo like Neutrogena TGel  Try to do vaccines between stelara injections    If any of your medications are too expensive, look for a coupon at [email or web address] or reference the application on a smartphone  - Enter the medication name, size, and your zip code to find coupons for local pharmacies.   - Print a coupon and bring it to the pharmacy, or pull up the coupon on a smartphone.  - You can also call pharmacies to ask about the cost of your medication before you pick it up.  If you still cannot afford your medication, please let us know.

## 2021-01-14 NOTE — Unmapped (Addendum)
Dermatology Note     Assessment and Plan:      Plaque psoriasis, improved but with ongoing activity 4%BSA  - Continue stelara. Suspect psoriasis will continue to improve with future injections. Recommend scheduling vaccinations in between injections to maximize efficacy.  - Restart clobetasoL (TEMOVATE) 0.05 % ointment; Apply topically Two (2) times a day. Apply the medication twice daily to thick, scaly, or red areas on the skin until smooth. Then stop and re-start as soon as the skin changes come back.  - Recommend starting TGel shampoo.  - Restart fluocinolone oil on the scalp as needed.  - Counseled on appropriate use of topical steroids. Discussed the common side effects of topical steroids including epidermal atrophy, striae formation, hypopigmentation.    Keratosis pilaris  - Discussed that this is a common, benign, genetic skin condition.  - Recommended moisturizers, peeling creams, and PRN topical steroids very sparingly which may help with any associated symptoms.    High risk medication use  -History of positive TST, patient will need yearly chest x-rays. Last 10/2020    Papules on the chest  - Unclear if this is related to stelara or something else she is coming into contact with  - Recommend moisturizing and PRN steroids.  - Counseled on appropriate use of topical steroids. Discussed the common side effects of topical steroids including epidermal atrophy, striae formation, hypopigmentation.  - counseled on flu and COVID vaccine timing with Stelara    The patient was advised to call for an appointment should any new, changing, or symptomatic lesions develop.     RTC: Return in about 6 months (around 07/15/2021) for psoriasis. or sooner as needed   _________________________________________________________________      Chief Complaint     Chief Complaint   Patient presents with   ??? Psoriasis     Pt c/o follow up visit for psoriasis, pt states that she has had some itching and redness.        HPI     Kelsey Nguyen is a 40 y.o. female who presents as a returning patient (last seen 10/05/2020) to [facility] for follow up of psoraisis. At last appt, stelara was initiated. She has had 2 injections and is doing much better. She has persistent areas on the lower legs and has some new scaly plaques on lateral heels. She ran out of her clobetasol ointment. She has some rough papules on the arms that are occasionally itchy. Since starting stelara, she has had some occasional itching. Today she has some itchy bumps on the chest    The patient denies any other new or changing lesions or areas of concern.     Pertinent Past Medical History     Psoriasis    Past Medical History, Family History, Social History, Medication List, Allergies, and Problem List were reviewed in the rooming section of Epic.     ROS: Other than symptoms mentioned in the HPI, no fevers, chills, or other skin complaints    Physical Examination     GENERAL: Well-appearing female in no acute distress, resting comfortably.  NEURO: Alert and oriented, answers questions appropriately  PSYCH: Normal mood and affect  RESP: No increased work of breathing  SKIN (Full Skin Exam): Examination of the face, eyelids, lips, nose, ears, neck, chest, abdomen, back, arms, legs, hands, feet, palms, soles, nails was performed  - scattered, few pink small papules on the chest  - Well-demarcated erythematous plaques with silvery scale distributed over the lower legs  -  keratotic plaques of the lateral heels  - Spiny follicular papules with mild surrounding erythema located on the arms    All areas not commented on are within normal limits or unremarkable      (Approved Template 12/26/2019)

## 2021-02-20 NOTE — Unmapped (Signed)
[facility] Refill Coordination Note    Specialty Medication(s) to be Shipped:   Inflammatory Disorders: Stelara    Other medication(s) to be shipped: No additional medications requested for fill at this time     Kelsey Nguyen, DOB: [birth year]  Phone: [phone] (home)       All above HIPAA information was verified with patient.     Was a Nurse, learning disability used for this call? No    Completed refill call assessment today to schedule patient's medication shipment from the [facility] [phone]).  All relevant notes have been reviewed.     Specialty medication(s) and dose(s) confirmed: Regimen is correct and unchanged.   Changes to medications: Kelsey Nguyen reports no changes at this time.  Changes to insurance: No  New side effects reported not previously addressed with a pharmacist or physician: None reported  Questions for the pharmacist: No    Confirmed patient received a Conservation officer, historic buildings and a Surveyor, mining with first shipment. The patient will receive a drug information handout for each medication shipped and additional FDA Medication Guides as required.       DISEASE/MEDICATION-SPECIFIC INFORMATION        For patients on injectable medications: Patient currently has 0 doses left.  Next injection is scheduled for 11/20/2.    SPECIALTY MEDICATION ADHERENCE     Medication Adherence    Specialty Medication: STELARA              Were doses missed due to medication being on hold? No    Stelara 45/0.5 mg/ml: 0 days of medicine on hand        REFERRAL TO PHARMACIST     Referral to the pharmacist: Not needed      [facility]     Shipping address confirmed in Epic.     Delivery Scheduled: Yes, Expected medication delivery date: 02/27/21.     Medication will be delivered via UPS to the prescription address in Epic WAM.    Unk Lightning   [facility] Specialty Technician

## 2021-02-26 MED FILL — STELARA 45 MG/0.5 ML SUBCUTANEOUS SYRINGE: 84 days supply | Qty: 0.5 | Fill #0

## 2021-03-20 DIAGNOSIS — L400 Psoriasis vulgaris: Principal | ICD-10-CM

## 2021-03-20 MED ORDER — CLOBETASOL 0.05 % SCALP SOLUTION
Freq: Two times a day (BID) | TOPICAL | 5 refills | 0.00000 days
Start: 2021-03-20 — End: 2022-03-20

## 2021-03-20 NOTE — Unmapped (Signed)
Returned call to patient after message left on nurse line stating she did not feel comfortable about this last injection of Stelara.  In talking with patient, she said the pen did make the correct sounds when she injected the medication but the needle was not all the way in under the skin.  Patient did not feel confident about the injection and she does not take it but every 12 weeks.  Patient then said she was developing more lesions of psoriasis on scalp and body so would like the refill for clobetasol ext solution.  Should already have refill for clobetasol ointment since was ordered in October.

## 2021-03-21 MED ORDER — CLOBETASOL 0.05 % TOPICAL OINTMENT
Freq: Two times a day (BID) | TOPICAL | 3 refills | 0.00000 days | Status: CP
Start: 2021-03-21 — End: 2022-03-21

## 2021-03-21 NOTE — Unmapped (Signed)
Returned patient's phone call regarding issues injecting stelara. Patient did inject the full dose under the skin, it just was not deep as she usually injects. Reassured patient that that should be sufficient and we would not repeat the injection. She should continue on her every 12 wk dosing schedule. Patient requesting refill for clobetasol ointment which I sent in to pharmacy. All questions answered.

## 2021-05-09 NOTE — Unmapped (Signed)
[facility] Refill Coordination Note    Specialty Medication(s) to be Shipped:   Inflammatory Disorders: Stelara    Other medication(s) to be shipped: No additional medications requested for fill at this time     Kelsey Nguyen, DOB: 05/19/1980  Phone: [phone] (home)       All above HIPAA information was verified with patient.     Was a Nurse, learning disability used for this call? No    Completed refill call assessment today to schedule patient's medication shipment from the [facility] [phone]).  All relevant notes have been reviewed.     Specialty medication(s) and dose(s) confirmed: Regimen is correct and unchanged.   Changes to medications: Kelsey Nguyen reports no changes at this time.  Changes to insurance: No  New side effects reported not previously addressed with a pharmacist or physician: None reported  Questions for the pharmacist: Yes: asked if she can take early, transferred to [facility] to counsel    Confirmed patient received a Conservation officer, historic buildings and a Surveyor, mining with first shipment. The patient will receive a drug information handout for each medication shipped and additional FDA Medication Guides as required.       DISEASE/MEDICATION-SPECIFIC INFORMATION        For patients on injectable medications: Patient currently has 0 doses left.  Next injection is scheduled for 05/26/2021.    SPECIALTY MEDICATION ADHERENCE     Medication Adherence    Patient reported X missed doses in the last month: 0  Specialty Medication: Stelara  Patient is on additional specialty medications: No  Any gaps in refill history greater than 2 weeks in the last 3 months: no  Demonstrates understanding of importance of adherence: yes  Informant: patient  Reliability of informant: reliable  Confirmed plan for next specialty medication refill: delivery by pharmacy  Refills needed for supportive medications: not needed              Were doses missed due to medication being on hold? No    Stelara 45/0.5 mg/ml: 0 days of medicine on hand        REFERRAL TO PHARMACIST     Referral to the pharmacist: Not needed      [facility]     Shipping address confirmed in Epic.     Delivery Scheduled: Yes, Expected medication delivery date: 05/16/2021.     Medication will be delivered via Same Day Courier to the prescription address in Epic WAM.    Kelsey Nguyen D Larena Ohnemus   [facility] Specialty Technician

## 2021-05-16 MED FILL — STELARA 45 MG/0.5 ML SUBCUTANEOUS SYRINGE: 84 days supply | Qty: 0.5 | Fill #1

## 2021-07-15 ENCOUNTER — Encounter: Payer: Self-pay | Admitting: Emergency Medicine

## 2021-07-15 ENCOUNTER — Ambulatory Visit
Admission: EM | Admit: 2021-07-15 | Discharge: 2021-07-15 | Disposition: A | Discharge status: Home / Self Care | Payer: 59 | Attending: Emergency Medicine | Admitting: Emergency Medicine

## 2021-07-15 DIAGNOSIS — S61012A Laceration without foreign body of left thumb without damage to nail, initial encounter: Secondary | ICD-10-CM

## 2021-07-15 MED ORDER — TETANUS-DIPHTH-ACELL PERTUSSIS 5-2.5-18.5 LF-MCG/0.5 IM SUSY
0.5000 mL | PREFILLED_SYRINGE | Freq: Once | INTRAMUSCULAR | Status: AC
  Administered 2021-07-15: 0.5 mL via INTRAMUSCULAR

## 2021-07-15 NOTE — ED Provider Notes (Signed)
?[facility] ? ? ? ?CSN: [id number] ?Arrival date & time: 07/15/21  0488 ? ? ?  ? ?History   ?Chief Complaint ?Chief Complaint  ?Patient presents with  ? Laceration  ? ? ?HPI ?Stacy Best is a 41 y.o. female.  Patient presents with a laceration on her left thumb that occurred this morning when she cut it on a knife in the kitchen.  Bleeding controlled with direct pressure.  She denies numbness, weakness, paresthesias, or other symptoms.  Last tetanus unknown. ? ?The history is provided by the patient.  ? ?Past Medical History:  ?Diagnosis Date  ? Depression   ? Gestational diabetes   ? Headache   ? ? ?Patient Active Problem List  ? Diagnosis Date Noted  ? 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 Gestational thrombocytopenia (HCC) 05/11/2015  ? Fetal macrosomia, delivered, current hospitalization 05/11/2015  ? S/P cesarean section 05/10/2015  ? Preterm labor in third trimester without delivery 04/17/2015  ? Medically noncompliant 04/12/2015  ? Gestational diabetes mellitus 01/23/2015  ? Obesity in pregnancy 11/17/2014  ? Advanced maternal age in multigravida 11/17/2014  ? H/O cesarean section complicating pregnancy 11/17/2014  ? H/O miscarriage, currently pregnant 11/17/2014  ? Normal pregnancy, incidental 05/02/2013  ? ? ?Past Surgical History:  ?Procedure Laterality Date  ? ANKLE FRACTURE SURGERY Left   ? CESAREAN SECTION  2015  ? CESAREAN SECTION N/A 05/10/2015  ? Procedure: REPEAT C SECTION;  Surgeon: Hildred Laser, MD;  Location: [facility] ORS;  Service: Obstetrics;  Laterality: N/A;  PUT ON THE BOOKS WITH L&D FOR 7:30AM  ? FRACTURE SURGERY    ? TUBAL LIGATION Bilateral 07/16/2015  ? Procedure: ESSURE BILATERAL TUBAL STERILIZATION;  Surgeon: Hildred Laser, MD;  Location: [facility] ORS;  Service: Gynecology;  Laterality: Bilateral;  ? ? ?OB History   ? ? Gravida  ?4  ? Para  ?2  ? Term  ?2  ? Preterm  ?   ? AB  ?2  ? Living  ?2  ?  ? ? SAB  ?1  ? IAB  ?1  ? Ectopic  ?   ? Multiple  ?0  ? Live Births  ?2  ?    ?  ? Obstetric Comments  ?Patient dilated to 9 cm prior to C-section for NRFHT (persistent tachycardia due to chorioamnionitis); prolonged labor  ?  ? ?  ? ? ? ?Home Medications   ? ?Prior to Admission medications   ?Medication Sig Start Date End Date Taking? Authorizing Provider  ?HYDROcodone-acetaminophen (NORCO/VICODIN) 5-325 MG tablet Take 1 tablet by mouth every 6 (six) hours as needed. 07/16/15   Hildred Laser, MD  ?ibuprofen (ADVIL,MOTRIN) 800 MG tablet Take 1 tablet (800 mg total) by mouth every 8 (eight) hours as needed. 07/16/15   Hildred Laser, MD  ? ? ?Family History ?Family History  ?Problem Relation Age of Onset  ? Breast cancer Mother 28  ? Diabetes Father   ? Ovarian cancer Neg Hx   ? Colon cancer Neg Hx   ? Heart disease Neg Hx   ? ? ?Social History ?Social History  ? ?Tobacco Use  ? Smoking status: Never  ? Smokeless tobacco: Never  ?Vaping Use  ? Vaping Use: Never used  ?Substance Use Topics  ? Alcohol use: Yes  ?  Alcohol/week: 0.0 standard drinks  ?  Comment: SOCIALLY  ? Drug use: No  ? ? ? ?Allergies   ?Nitrofurantoin and Keflex [cephalexin] ? ? ?Review of Systems ?Review of Systems  ?  Constitutional:  Negative for chills and fever.  ?Skin:  Positive for wound. Negative for color change.  ?Neurological:  Negative for weakness and numbness.  ?All other systems reviewed and are negative. ? ? ?Physical Exam ?Triage Vital Signs ?ED Triage Vitals  ?Enc Vitals Group  ?   BP   ?   Pulse   ?   Resp   ?   Temp   ?   Temp src   ?   SpO2   ?   Weight   ?   Height   ?   Head Circumference   ?   Peak Flow   ?   Pain Score   ?   Pain Loc   ?   Pain Edu?   ?   Excl. in GC?   ? ?No data found. ? ?Updated Vital Signs ?BP 116/70   Pulse 81   Temp 98.2 ?F (36.8 ?C)   Resp 18   LMP 07/11/2021   SpO2 98%  ? ?Visual Acuity ?Right Eye Distance:   ?Left Eye Distance:   ?Bilateral Distance:   ? ?Right Eye Near:   ?Left Eye Near:    ?Bilateral Near:    ? ?Physical Exam ?Vitals and nursing note reviewed.   ?Constitutional:   ?   General: She is not in acute distress. ?   Appearance: She is well-developed. She is not ill-appearing.  ?HENT:  ?   Mouth/Throat:  ?   Mouth: Mucous membranes are moist.  ?Cardiovascular:  ?   Rate and Rhythm: Normal rate and regular rhythm.  ?   Heart sounds: Normal heart sounds.  ?Pulmonary:  ?   Effort: Pulmonary effort is normal. No respiratory distress.  ?   Breath sounds: Normal breath sounds.  ?Musculoskeletal:     ?   General: Normal range of motion.  ?   Cervical back: Neck supple.  ?Skin: ?   General: Skin is warm and dry.  ?   Capillary Refill: Capillary refill takes less than 2 seconds.  ?   Findings: Lesion present.  ?   Comments: Left thumb: 3 cm x 1 cm area of superficial skin avulsion. No active bleeding. FROM, sensation intact, strength 5/5, brisk capillary refill.    ?Neurological:  ?   General: No focal deficit present.  ?   Mental Status: She is alert and oriented to person, place, and time.  ?   Sensory: No sensory deficit.  ?   Motor: No weakness.  ?Psychiatric:     ?   Mood and Affect: Mood normal.     ?   Behavior: Behavior normal.  ? ? ? ?UC Treatments / Results  ?Labs ?(all labs ordered are listed, but only abnormal results are displayed) ?Labs Reviewed - No data to display ? ?EKG ? ? ?Radiology ?No results found. ? ?Procedures ?Procedures (including critical care time) ? ?Medications Ordered in UC ?Medications  ?Tdap (BOOSTRIX) injection 0.5 mL (0.5 mLs Intramuscular Given 07/15/21 0926)  ? ? ?Initial Impression / Assessment and Plan / UC Course  ?I have reviewed the triage vital signs and the nursing notes. ? ?Pertinent labs & imaging results that were available during my care of the patient were reviewed by me and considered in my medical decision making (see chart for details). ? ?  ?Superficial avulsion laceration of left thumb.  No indication for sutures.  Tetanus updated.  Wound cleaned and dressed.  Discussed wound care and signs of infection with patient.  Education provided on nonsutured wound care.  Instructed her to follow-up with her PCP or return here if she notes signs of infection.  She agrees to plan of care. ? ?Final Clinical Impressions(s) / UC Diagnoses  ? ?Final diagnoses:  ?Laceration of left thumb without foreign body without damage to nail, initial encounter  ? ? ? ?Discharge Instructions   ? ?  ?Keep your wound clean and dry.  Wash it gently twice a day with soap and water.  Apply an antibiotic cream and bandage twice a day.   ? ?Return here if you see signs of infection, such as increased pain, redness, pus-like drainage, warmth, fever, chills, or other concerning symptoms.   ? ?Your tetanus was updated today.   ? ? ? ? ? ? ? ?ED Prescriptions   ?None ?  ? ?PDMP not reviewed this encounter. ?  ?Mickie Bail, NP ?07/15/21 [phone] ? ?

## 2021-07-15 NOTE — Discharge Instructions (Addendum)
Keep your wound clean and dry.  Wash it gently twice a day with soap and water.  Apply an antibiotic cream and bandage twice a day.   ? ?Return here if you see signs of infection, such as increased pain, redness, pus-like drainage, warmth, fever, chills, or other concerning symptoms.   ? ?Your tetanus was updated today.   ? ? ? ?

## 2021-07-15 NOTE — ED Triage Notes (Signed)
Pt has a cut to her left thumb this morning with a knife. ?

## 2021-08-08 NOTE — Unmapped (Signed)
Kelsey Nguyen continues to do well on her Stelara. She reports some psoriasis returning towards end of dosing cycle in Feb, but none so far (dose due in ~10 days).     She had a UTI last month and possibly again now - she'll continue to monitor and reach back out to her PCP for treatment.     [facility] Clinical Assessment & Refill Coordination Note    Kelsey Nguyen, DOB: Sep 05, 1980  Phone: [phone] (home)     All above HIPAA information was verified with patient.     Was a Nurse, learning disability used for this call? No    Specialty Medication(s):   Inflammatory Disorders: Stelara     Current Outpatient Medications   Medication Sig Dispense Refill    apremilast 10 mg (4)-20 mg (4)-30 mg (47) DsPk Take as directed on starter pack (Patient not taking: Reported on 01/11/2021) 55 tablet 0    clobetasoL (TEMOVATE) 0.05 % external solution Apply topically Two (2) times a day. 50 mL 5    clobetasoL (TEMOVATE) 0.05 % ointment Apply topically Two (2) times a day. Apply the medication twice daily to thick, scaly, or red areas on the skin until smooth. Then stop and re-start as soon as the skin changes come back. 60 g 3    empty container Misc Use as directed to dispose of Stelara syringes. 1 each 2    famotidine (PEPCID) 20 MG tablet Take 1 tablet (20 mg total) by mouth Two (2) times a day. 30 tablet 0    norgestimate-ethinyl estradiol (ORTHO-CYCLEN) 0.25-35 mg-mcg per tablet Take 1 tablet by mouth daily. 84 tablet 11    ustekinumab (STELARA) 45 mg/0.5 mL Syrg syringe Inject the contents of 1 syringe (45 mg) under the skin at 0 and 4 weeks, and THEN 1 syringe (45mg ) every 12 weeks thereafter. 1 mL 0    ustekinumab (STELARA) 45 mg/0.5 mL Syrg syringe Inject the contents of 1 syringe (0.5 mL) under the skin every 12 weeks. 0.5 mL 3     No current facility-administered medications for this visit.        Changes to medications: Timmy reports no changes at this time.    Allergies   Allergen Reactions Cephalexin Hives and Rash    Nitrofurantoin Monohyd/M-Cryst Hives     Other reaction(s): anaphylaxis       Changes to allergies: No    SPECIALTY MEDICATION ADHERENCE     Stelara - 0 left  Medication Adherence    Patient reported X missed doses in the last month: 0  Specialty Medication: Stelara          Specialty medication(s) dose(s) confirmed: Regimen is correct and unchanged.     Are there any concerns with adherence? No    Adherence counseling provided? Not needed    CLINICAL MANAGEMENT AND INTERVENTION      Clinical Benefit Assessment:    Do you feel the medicine is effective or helping your condition? Yes    Clinical Benefit counseling provided? Not needed    Adverse Effects Assessment:    Are you experiencing any side effects? No    Are you experiencing difficulty administering your medicine? No    Quality of Life Assessment:    Quality of Life    Rheumatology  Oncology  Dermatology  1. What impact has your specialty medication had on the symptoms of your skin condition (i.e. itchiness, soreness, stinging)?: Tremendous  2. What impact has your specialty medication had  on your comfort level with your skin?: Tremendous  Cystic Fibrosis          Have you discussed this with your provider? Not needed    Acute Infection Status:    Acute infections noted within Epic:  No active infections  Patient reported infection:  unclear - mild sxs of UTI. Would be appropriate to continue Stelara. Patient will reach back out to provider about possible treatment.    Therapy Appropriateness:    Is therapy appropriate and patient progressing towards therapeutic goals? Yes, therapy is appropriate and should be continued    DISEASE/MEDICATION-SPECIFIC INFORMATION      For patients on injectable medications: Patient currently has 0 doses left.  Next injection is scheduled for 5/7.    PATIENT SPECIFIC NEEDS     Does the patient have any physical, cognitive, or cultural barriers? No    Is the patient high risk? No    Does the patient require a Care Management Plan? No     SHIPPING     Specialty Medication(s) to be Shipped:   Inflammatory Disorders: Stelara    Other medication(s) to be shipped: No additional medications requested for fill at this time     Changes to insurance: No    Delivery Scheduled: Yes, Expected medication delivery date: Thurs, 5/4.     Medication will be delivered via Same Day Courier to the confirmed prescription address in [facility].    The patient will receive a drug information handout for each medication shipped and additional FDA Medication Guides as required.  Verified that patient has previously received a Conservation officer, historic buildings and a Surveyor, mining.    The patient or caregiver noted above participated in the development of this care plan and knows that they can request review of or adjustments to the care plan at any time.      All of the patient's questions and concerns have been addressed.    Lanney Gins   [facility] Specialty Pharmacist

## 2021-08-15 MED FILL — STELARA 45 MG/0.5 ML SUBCUTANEOUS SYRINGE: 84 days supply | Qty: 0.5 | Fill #2

## 2021-10-07 DIAGNOSIS — L400 Psoriasis vulgaris: Principal | ICD-10-CM

## 2021-10-07 MED ORDER — STELARA 45 MG/0.5 ML SUBCUTANEOUS SYRINGE
3 refills | 0 days
Start: 2021-10-07 — End: ?

## 2021-10-10 DIAGNOSIS — L400 Psoriasis vulgaris: Principal | ICD-10-CM

## 2021-10-10 MED ORDER — STELARA 45 MG/0.5 ML SUBCUTANEOUS SYRINGE
2 refills | 0 days | Status: CP
Start: 2021-10-10 — End: ?
  Filled 2021-11-11: qty 0.5, 84d supply, fill #0

## 2021-10-10 NOTE — Unmapped (Signed)
[facility] Refill Coordination Note    Specialty Medication(s) to be Shipped:   Inflammatory Disorders: Stelara    Other medication(s) to be shipped: No additional medications requested for fill at this time     Kelsey Nguyen, DOB: [birth year]  Phone: [phone] (home)       All above HIPAA information was verified with patient.     Was a Nurse, learning disability used for this call? No    Completed refill call assessment today to schedule patient's medication shipment from the [facility] [phone]).  All relevant notes have been reviewed.     Specialty medication(s) and dose(s) confirmed: Regimen is correct and unchanged.   Changes to medications: Kelsey Nguyen reports no changes at this time.  Changes to insurance: No  New side effects reported not previously addressed with a pharmacist or physician: None reported  Questions for the pharmacist: No    Confirmed patient received a Conservation officer, historic buildings and a Surveyor, mining with first shipment. The patient will receive a drug information handout for each medication shipped and additional FDA Medication Guides as required.       DISEASE/MEDICATION-SPECIFIC INFORMATION        For patients on injectable medications: Patient currently has 0 doses left.  Next injection is scheduled for 11/10/2021.    SPECIALTY MEDICATION ADHERENCE     Medication Adherence    Patient reported X missed doses in the last month: 0  Specialty Medication: Stelara  Patient is on additional specialty medications: No  Any gaps in refill history greater than 2 weeks in the last 3 months: no  Demonstrates understanding of importance of adherence: yes  Informant: patient  Reliability of informant: reliable  Confirmed plan for next specialty medication refill: delivery by pharmacy  Refills needed for supportive medications: not needed              Were doses missed due to medication being on hold? No    Stelara 45/0.5 mg/ml: 0 days of medicine on hand        REFERRAL TO PHARMACIST     Referral to the pharmacist: Not needed      [facility]     Shipping address confirmed in Epic.     Delivery Scheduled: Not scheduled at this time due to RFTS until 7/27. MNF asst card expires on 10/18/21. Patient will re-apply for MNF asst. Will follow up and reschedule call.     Medication will be delivered via not scheduled to the not scheduled address in Epic WAM.    Jennifer Payes D Keiera Strathman   [facility] Specialty Technician

## 2021-10-31 NOTE — Unmapped (Signed)
Mfg assistance pending

## 2021-11-08 NOTE — Unmapped (Signed)
[facility] Refill Coordination Note    Specialty Medication(s) to be Shipped:   Inflammatory Disorders: Stelara    Other medication(s) to be shipped: No additional medications requested for fill at this time     Kelsey Nguyen, DOB: 1980-07-26  Phone: [phone] (home)       All above HIPAA information was verified with patient.     Was a Nurse, learning disability used for this call? No    Completed refill call assessment today to schedule patient's medication shipment from the [facility] [phone]).  All relevant notes have been reviewed.     Specialty medication(s) and dose(s) confirmed: Regimen is correct and unchanged.   Changes to medications: [name] reports no changes at this time.  Changes to insurance: No  New side effects reported not previously addressed with a pharmacist or physician: None reported  Questions for the pharmacist: No    Confirmed patient received a Conservation officer, historic buildings and a Surveyor, mining with first shipment. The patient will receive a drug information handout for each medication shipped and additional FDA Medication Guides as required.       DISEASE/MEDICATION-SPECIFIC INFORMATION        For patients on injectable medications: Patient currently has 0 doses left.  Next injection is scheduled for 11/10/2021.    SPECIALTY MEDICATION ADHERENCE     Medication Adherence    Patient reported X missed doses in the last month: 0  Specialty Medication: Stelara  Patient is on additional specialty medications: No  Any gaps in refill history greater than 2 weeks in the last 3 months: no  Demonstrates understanding of importance of adherence: yes  Informant: patient  Reliability of informant: reliable  Confirmed plan for next specialty medication refill: delivery by pharmacy  Refills needed for supportive medications: not needed              Were doses missed due to medication being on hold? No    Stelara 45/0.5 mg/ml: 0 days of medicine on hand        REFERRAL TO PHARMACIST     Referral to the pharmacist: Not needed      [facility]     Shipping address confirmed in Epic.     Delivery Scheduled: Yes, Expected medication delivery date: 11/11/2021.     Medication will be delivered via Same Day Courier to the prescription address in Epic WAM.    Valere Dross   [facility] Specialty Technician

## 2022-01-20 NOTE — Unmapped (Signed)
[facility] Refill Coordination Note    Specialty Medication(s) to be Shipped:   Inflammatory Disorders: Stelara    Other medication(s) to be shipped: No additional medications requested for fill at this time     Kelsey Nguyen, DOB: 10-16-1980  Phone: [phone] (home)       All above HIPAA information was verified with patient.     Was a Nurse, learning disability used for this call? No    Completed refill call assessment today to schedule patient's medication shipment from the [facility] [phone]).  All relevant notes have been reviewed.     Specialty medication(s) and dose(s) confirmed: Regimen is correct and unchanged.   Changes to medications: Kelsey Nguyen reports no changes at this time.  Changes to insurance: No  New side effects reported not previously addressed with a pharmacist or physician: None reported  Questions for the pharmacist: No    Confirmed patient received a Conservation officer, historic buildings and a Surveyor, mining with first shipment. The patient will receive a drug information handout for each medication shipped and additional FDA Medication Guides as required.       DISEASE/MEDICATION-SPECIFIC INFORMATION        For patients on injectable medications: Patient currently has 0 doses left.  Next injection is scheduled for 10/22.    SPECIALTY MEDICATION ADHERENCE     Medication Adherence    Patient reported X missed doses in the last month: 0  Specialty Medication: Stelara  Patient is on additional specialty medications: No  Any gaps in refill history greater than 2 weeks in the last 3 months: no  Demonstrates understanding of importance of adherence: yes  Informant: patient  Reliability of informant: reliable              Confirmed plan for next specialty medication refill: delivery by pharmacy  Refills needed for supportive medications: not needed              Were doses missed due to medication being on hold? No    Stelara 45/0.5 mg/ml: 0 days of medicine on hand REFERRAL TO PHARMACIST     Referral to the pharmacist: Not needed      [facility]     Shipping address confirmed in Epic.     Delivery Scheduled: Yes, Expected medication delivery date: 10/20.     Medication will be delivered via Same Day Courier to the prescription address in Epic WAM.    Valere Dross   [facility] Specialty Technician

## 2022-01-29 ENCOUNTER — Encounter: Payer: Self-pay | Admitting: Obstetrics and Gynecology

## 2022-01-31 MED FILL — STELARA 45 MG/0.5 ML SUBCUTANEOUS SYRINGE: 84 days supply | Qty: 0.5 | Fill #1

## 2022-07-10 NOTE — Unmapped (Signed)
The [facility] has made a third and final attempt to reach this patient to refill the following medication: Stelara.      We have left voicemail with patient at the following phone numbers: [phone] (1/10 and 3/28, patient would prefer to call back) and have sent a text message to the following phone numbers: [phone] (1/5) .    Dates contacted: 1/5, 1/10, 3/28  Last scheduled delivery: 01/31/22    The patient may be at risk of non-compliance with this medication. The patient should call the [facility] at [phone]  Option 4, then Option 2 (all other specialty patients) to refill medication.    Lanney Gins   [facility] Shared [facility] Specialty Pharmacist

## 2022-07-31 NOTE — Unmapped (Signed)
[facility] Refill Coordination Note    Specialty Medication(s) to be Shipped:   Inflammatory Disorders: Stelara    Other medication(s) to be shipped: No additional medications requested for fill at this time     Kelsey Nguyen, DOB: Jul 18, 1980  Phone: [phone] (home)       All above HIPAA information was verified with patient.     Was a Nurse, learning disability used for this call? No    Completed refill call assessment today to schedule patient's medication shipment from the [facility] [phone]).  All relevant notes have been reviewed.     Specialty medication(s) and dose(s) confirmed: Regimen is correct and unchanged.   Changes to medications: Kelsey Nguyen reports no changes at this time.  Changes to insurance: No  New side effects reported not previously addressed with a pharmacist or physician: Yes - Patient reports weight gain. Patient would like to speak to the pharmacist today. Their provider is not aware.  Questions for the pharmacist: Yes: wants to confirm when to take next dose and talk about weight gain on med.    Confirmed patient received a Conservation officer, historic buildings and a Surveyor, mining with first shipment. The patient will receive a drug information handout for each medication shipped and additional FDA Medication Guides as required.       DISEASE/MEDICATION-SPECIFIC INFORMATION        For patients on injectable medications: Patient currently has 0 doses left.  Next injection is scheduled for 4/22.    SPECIALTY MEDICATION ADHERENCE     Medication Adherence    Patient reported X missed doses in the last month: 0  Specialty Medication: STELARA 45 mg/0.5 mL Syrg syringe (ustekinumab)  Patient is on additional specialty medications: No  Patient is on more than two specialty medications: No  Any gaps in refill history greater than 2 weeks in the last 3 months: no  Demonstrates understanding of importance of adherence: yes  Informant: patient  Confirmed plan for next specialty medication refill: delivery by pharmacy  Refills needed for supportive medications: not needed          Refill Coordination    Has the Patients' Contact Information Changed: No  Is the Shipping Address Different: No         Were doses missed due to medication being on hold? No    STELARA 45 /0.5   mg/ml: 0 days of medicine on hand     REFERRAL TO PHARMACIST     Referral to the pharmacist: Not needed      [facility]     Shipping address confirmed in Epic.       Delivery Scheduled: Yes, Expected medication delivery date: 08/04/2022.     Medication will be delivered via Same Day Courier to the prescription address in Epic WAM.    Kelsey Nguyen   [facility] Specialty Technician

## 2022-08-04 NOTE — Unmapped (Signed)
Kelsey Nguyen 's Stelara shipment will be delayed as a result of insufficient inventory of the drug.     I have reached out to the patient and communicated the delay. We will reschedule the medication for the delivery date that the patient agreed upon.  We have confirmed the delivery date as 08/05/22, via same day courier.

## 2022-08-05 MED FILL — STELARA 45 MG/0.5 ML SUBCUTANEOUS SYRINGE: 84 days supply | Qty: 0.5 | Fill #2

## 2022-08-13 NOTE — Unmapped (Signed)
Appointment on 06/20 with Dr Dub Mikes. Patient added to wait list as well    Thanks,  Toni Amend

## 2022-08-13 NOTE — Unmapped (Signed)
[facility] Clinical Intervention    Type of intervention: Medication storage issue    Medication involved: Stelara    Problem identified: Kelsey Nguyen called to report she had left her Stelara dose in the car for the past several days and isn't sure if it's safe to use.    Intervention performed: I advised Stelara has stability data up to 86 degrees for 30 days. It's hard to know exactly if her car got above that or not. We reviewed the medication should not cause harm, but could potentially be denatured some/lose efficacy. I offered to reach out to [facility] for a potential replacement, but also advised that we no longer have refills on her order, and we've had cases where [name] has denied replacement for user error scenarios (so don't toss the device), and that it could potentially delay her dose further while we work on an answer/replacement product (usually >1 week). Since she's overdue to take her medication (last in Oct 2023), she elected to use medication.     I will reach out to office to see about a potential reload since she missed her Jan dose and hasn't take since (until now). I advised it looks like she's due for a clinic visit and her providers may want to see her first.    Follow-up needed: yes, will plan to connect with patient once we learn how clinic would like to handle.     Approximate time spent: >20 minutes    Clinical evidence used to support intervention: Drug information resource    Result of the intervention: Improved medication adherence    [name] A Desiree Lucy [facility] Specialty Pharmacist

## 2022-09-15 DIAGNOSIS — L400 Psoriasis vulgaris: Principal | ICD-10-CM

## 2022-09-15 MED ORDER — CLOBETASOL 0.05 % TOPICAL OINTMENT
Freq: Two times a day (BID) | TOPICAL | 1 refills | 0.00000 days
Start: 2022-09-15 — End: 2023-09-15

## 2022-09-15 NOTE — Unmapped (Signed)
Received call on nurse phone line, pt has been off Stelara for 6 months and is not having a bad skin flare. Pt requesting Clobetasol refill for temporary relief until upcoming appointment. Last appointment was in 2022, Next visit scheduled for 10/02/2022. Medication pended for provider review.

## 2022-09-16 MED ORDER — CLOBETASOL 0.05 % TOPICAL OINTMENT
Freq: Two times a day (BID) | TOPICAL | 0 refills | 0.00000 days | Status: CP
Start: 2022-09-16 — End: 2023-09-16

## 2022-09-18 DIAGNOSIS — L400 Psoriasis vulgaris: Principal | ICD-10-CM

## 2022-09-18 MED ORDER — STELARA 45 MG/0.5 ML SUBCUTANEOUS SYRINGE
2 refills | 0.00000 days
Start: 2022-09-18 — End: ?

## 2022-09-18 MED ORDER — CLOBETASOL 0.05 % TOPICAL OINTMENT
Freq: Two times a day (BID) | TOPICAL | 0 refills | 0.00000 days
Start: 2022-09-18 — End: 2023-09-18

## 2022-09-18 NOTE — Unmapped (Signed)
[facility] Clinical Intervention    Type of intervention: Medication access    Medication involved: Stelara    Problem identified: Patient called to report she did not take dose from April/May (See Rph intervention note from 5/1) that we had discussed was OK to use. At this point, given we're out of 30 day window, I wouldn't recommend using. I offered to provide her with the mfg number but she declined for now.    Intervention performed: I advised we could request a refill, but clinic may prefer to see her first. If we get a prescription refill, we can call her to schedule. If not, I recommended patient call us on 6/20 after her visit, and we can likely get drug to her within a few days. If needed, we can call [employer] program for an override (since 25-Aug-2022 dose was coded as 12 week supply).      I recommended she use her topical steroid that was called in - and use occlusion with saran wrap/sock if needed at night for better penetration.     Follow-up needed: yes, will plan     Approximate time spent: >20 minutes    Clinical evidence used to support intervention: Professional judgement    Result of the intervention: Improved therapy effectiveness    Chrisanne Loose A Desiree Lucy [facility] Specialty Pharmacist

## 2022-09-18 NOTE — Unmapped (Signed)
Nothing sooner at this time  Patient added to wait list  Patient aware    Thanks,  Toni Amend

## 2022-09-18 NOTE — Unmapped (Signed)
Returned call to patient after she LVM on nurse line regarding her psoriasis worse with different symptoms.    Per patient, now has psoriasis on palms of hands and feet with burning sensations , has it on elbows also and scalp.  Patient very concerned since not taking her Stelara shots as directed.  Stated on message she has only had shot about 2 times in past year.  LOV 01/14/21 and was to RTC in 6 months from then.  Has scheduled ov on 10/02/22 with Dr. Dub Mikes.  Informed patient Dr. Dub Mikes will need to see her before application can be done for Stelara, especially with new symptoms.    Informed patient I will ask if any sooner appt is available before 10/02/22.  Advised will send message to Dr. Dub Mikes and scheduler.    Also, informed at [facility] she was instructed to try T Gel shampoo and to apply clobetasol to thick ,scaly or red areas on skin BID until areas are smooth.  Patient stated she has been using T Gel shampoo and clobetasol(just got filled).

## 2022-09-18 NOTE — Unmapped (Signed)
Called and spoke with patient per Dr. Dub Mikes and informed her that she is approved for her Stelara until 10/2022 and to call [facility] pharmacy at [phone] for refills.    Patient states will give them a call.

## 2022-10-01 DIAGNOSIS — L400 Psoriasis vulgaris: Principal | ICD-10-CM

## 2022-10-02 ENCOUNTER — Ambulatory Visit: Admit: 2022-10-02 | Discharge: 2022-10-02

## 2022-10-02 DIAGNOSIS — L400 Psoriasis vulgaris: Principal | ICD-10-CM

## 2022-10-02 DIAGNOSIS — Z79899 Other long term (current) drug therapy: Principal | ICD-10-CM

## 2022-10-02 MED ORDER — CLOBETASOL 0.05 % TOPICAL OINTMENT
Freq: Two times a day (BID) | TOPICAL | 6 refills | 0.00000 days | Status: CP
Start: 2022-10-02 — End: 2023-10-02

## 2022-10-02 MED ORDER — FLUOCINOLONE 0.01 % SCALP OIL AND SHOWER CAP
TOPICAL | 6 refills | 0.00000 days | Status: CP
Start: 2022-10-02 — End: ?

## 2022-10-02 MED ORDER — USTEKINUMAB 45 MG/0.5 ML SUBCUTANEOUS SYRINGE
SUBCUTANEOUS | 2 refills | 0.00000 days | Status: CP
Start: 2022-10-02 — End: ?
  Filled 2022-10-27: qty 0.5, 84d supply, fill #0

## 2022-10-02 NOTE — Unmapped (Signed)
[facility] Clinical Intervention    Type of intervention: Medication access    Medication involved: Stelara    Problem identified: Patient had visit with clinic today and plan is to resume Stelara. She previously missed April's dose (left out of fridge, did not take dose within 30 day window. See prior rph intervention notes from 5.1). Unfortunately mfg assist card is not allowing medication to be processed early - reports can be filled on 7/15 and does not allow override for lost/damaged product.       Intervention performed: I called Stelara replacement service to open a case - # [id number]. Currently we do not know lot/exp data, that will need to be provided for a replacement case to be provided.     If approved, medication would be shipped to [facility] and we would send to patient.        Follow-up needed: Yes, patient to call 1-800-JANSSEN to report lot/expiration number. I left a message for her informing her of this need and also instructed her to check her mychart for the case number (# [id number]).    Approximate time spent: >20 minutes    Clinical evidence used to support intervention: Professional judgement    Result of the intervention: Improved medication adherence    Elijah Phommachanh A Desiree Lucy Shared [facility] Specialty Pharmacist

## 2022-10-02 NOTE — Unmapped (Addendum)
[facility] releases most results to you as soon as they are available. Therefore, you may see some results before we do. Please give [place] 3 business days to review the tests and contact you by phone or through MyChart. If you are concerned that some results may be upsetting or confusing, you may wish to wait until we contact you before looking at the report in MyChart.   If you have an urgent question, you can call our clinic. MyChart should not be used for urgent issues. Otherwise, we prefer that you wait 3 business days for [place] to contact you.    [facility] Dermatology Clinical Team     Where to get chest X ray      To help moisturize hands

## 2022-10-02 NOTE — Unmapped (Signed)
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

## 2022-10-02 NOTE — Unmapped (Signed)
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 Patient previously improved on therapy but has not had injections for the past 6 months  - Restart clobetasoL (TEMOVATE) 0.05 % ointment; Apply topically Two (2) times a day. Apply the medication twice daily to thick, scaly, or red areas on the skin until smooth. Then stop and re-start as soon as the skin changes come back.  - Recommend starting TGel shampoo.  - Restart fluocinolone oil on the scalp as needed.  - Counseled on appropriate use of topical steroids. Discussed the common side effects of topical steroids including epidermal atrophy, striae formation, hypopigmentation.    High risk medication use  -History of positive TST, patient will need yearly chest x-rays. Last 10/2020  - Repeat CXR ordered today    The patient was advised to call for an appointment should any new, changing, or symptomatic lesions develop.     RTC: Return in about 11 months (around 09/01/2023). or sooner as needed   _________________________________________________________________      Chief Complaint     Chief Complaint   Patient presents with    Skin Check     FBSE    Stated that palms of hands and soles of feet are having a burning sensation stated that is having peeling of hands, was not able to do shot medication because it was in the car in extreme heat for two weeks, has not taken any medication since the beginning of the year        HPI     Kelsey Nguyen is a 42 y.o. female who presents as a returning patient (last seen 01/14/2021) to Dermatology for follow up of psoriasis. At last visit, continued Ustekinumab (Stelara) and restarted clobetasol and fluocinolone oil PRN.     Patient very concerned since not taking her Stelara shots as directed. Stated on message she has only had shot about 2 times in past year and one of them was left in the car. Has had flares on scalp, breast, dorsal hands, palms, soles. Has been using clobetasol which kind of helps.     The patient denies any other new or changing lesions or areas of concern.     Pertinent Past Medical History     No history of skin cancer    Problem List    None        Family History:   Negative for melanoma    Past Medical History, Family History, Social History, Medication List, Allergies, and Problem List were reviewed in the rooming section of Epic.     ROS: Other than symptoms mentioned in the HPI, no fevers, chills, or other skin complaints    Physical Examination     GENERAL: Well-appearing female in no acute distress, resting comfortably.  NEURO: Alert and oriented, answers questions appropriately  RESP: No increased work of breathing  SKIN: Examination of the scalp, face, upper chest, bilateral upper extremities, bilateral lower extremities, hands, feet, and nails was performed    Well demarcated brown plaques with micaceous scale on the dorsal hands, scalp  Hyperkeratotic plaques on the fingertips and palmar fingers with some fissures    All areas not commented on are within normal limits or unremarkable  - nail exam limited by polish      (Approved Template 12/26/2019)

## 2022-10-06 NOTE — Unmapped (Signed)
see 5/1 and 6/20 note from [facility]  -Patient did not want to reach out to [facility] to give expiration and lot number of the injection she did not administer due to not being worth her time. Calling 7/8 for a refill coordination for 7/15 due to insurance allowing Stelara to go through on that date.    [facility] Clinical Assessment & Refill Coordination Note    Kelsey Nguyen, DOB: 17-Jun-1980  Phone: [phone] (home)     All above HIPAA information was verified with patient.     Was a Nurse, learning disability used for this call? No    Specialty Medication(s):   Inflammatory Disorders: Stelara     Current Outpatient Medications   Medication Sig Dispense Refill    apremilast 10 mg (4)-20 mg (4)-30 mg (47) DsPk Take as directed on starter pack (Patient not taking: Reported on 01/11/2021) 55 tablet 0    clobetasol (TEMOVATE) 0.05 % ointment Apply topically two (2) times a day. Apply twice daily to thick, scaly, or red areas on the skin until smooth. Then stop and re-start as soon as the skin changes come back. 60 g 6    empty container Misc Use as directed to dispose of Stelara syringes. (Patient not taking: Reported on 10/02/2022) 1 each 2    famotidine (PEPCID) 20 MG tablet Take 1 tablet (20 mg total) by mouth Two (2) times a day. 30 tablet 0    fluocinolone (DERMA-SMOOTHE/FS SCALP OIL) 0.01 % external oil Apply once or twice a day to scalp. Leave in 120 mL 6    norgestimate-ethinyl estradiol (ORTHO-CYCLEN) 0.25-35 mg-mcg per tablet Take 1 tablet by mouth daily. 84 tablet 11    ustekinumab (STELARA) 45 mg/0.5 mL Syrg syringe Inject the contents of 1 syringe (0.5 mL) under the skin every 12 weeks. 0.5 mL 2     No current facility-administered medications for this visit.        Changes to medications: Kelsey Nguyen reports no changes at this time.    Allergies   Allergen Reactions    Cephalexin Hives and Rash    Nitrofurantoin Monohyd/M-Cryst Hives     Other reaction(s): anaphylaxis       Changes to allergies: No    SPECIALTY MEDICATION ADHERENCE     Stelara 45mg /0.20mL : 0 days of medicine on hand   Medication Adherence    Patient reported X missed doses in the last month: 1  Specialty Medication: ustekinumab (STELARA) 45 mg/0.5 mL Syrg syringe  Informant: patient  Confirmed plan for next specialty medication refill: delivery by pharmacy  Refills needed for supportive medications: not needed          Specialty medication(s) dose(s) confirmed: Regimen is correct and unchanged.     Are there any concerns with adherence? No    Adherence counseling provided? Not needed    CLINICAL MANAGEMENT AND INTERVENTION      Clinical Benefit Assessment:    Do you feel the medicine is effective or helping your condition? Yes    Clinical Benefit counseling provided? Not needed    Adverse Effects Assessment:    Are you experiencing any side effects? No    Are you experiencing difficulty administering your medicine? No    Quality of Life Assessment:    Quality of Life    Rheumatology  Oncology  Dermatology  1. What impact has your specialty medication had on the symptoms of your skin condition (i.e. itchiness, soreness, stinging)?: Tremendous  2.  What impact has your specialty medication had on your comfort level with your skin?: Tremendous  Cystic Fibrosis          How many days over the past month did your PP  keep you from your normal activities? For example, brushing your teeth or getting up in the morning. Patient declined to answer    Have you discussed this with your provider? Not needed    Acute Infection Status:    Acute infections noted within Epic:  No active infections  Patient reported infection: None    Therapy Appropriateness:    Is therapy appropriate and patient progressing towards therapeutic goals? Yes, therapy is appropriate and should be continued    DISEASE/MEDICATION-SPECIFIC INFORMATION      For patients on injectable medications: Patient currently has 0 doses left.  Next injection is scheduled for 10/27/2022.    Chronic Inflammatory Diseases: Have you experienced any flares in the last month? Yes, see 5/1 note from [facility].    PATIENT SPECIFIC NEEDS     Does the patient have any physical, cognitive, or cultural barriers? No    Is the patient high risk? No    Did the patient require a clinical intervention? No    Does the patient require physician intervention or other additional services (i.e., nutrition, smoking cessation, social work)? No    SOCIAL DETERMINANTS OF HEALTH     At the [facility], we have learned that life circumstances - like trouble affording food, housing, utilities, or transportation can affect the health of many of our patients.   That is why we wanted to ask: are you currently experiencing any life circumstances that are negatively impacting your health and/or quality of life? Patient declined to answer    Social Determinants of Health     Financial Resource Strain: Not on file   Internet Connectivity: Not on file   Food Insecurity: Not on file   Tobacco Use: Low Risk  (10/02/2022)    Patient History     Smoking Tobacco Use: Never     Smokeless Tobacco Use: Never     Passive Exposure: Not on file   Housing/Utilities: Not on file   Alcohol Use: Not on file   Transportation Needs: Not on file   Substance Use: Not on file   Health Literacy: Not on file   Physical Activity: Not on file   Interpersonal Safety: Not on file   Stress: Not on file   Intimate Partner Violence: Not on file   Depression: Not on file   Social Connections: Not on file       Would you be willing to receive help with any of the needs that you have identified today? Not applicable       SHIPPING     Specialty Medication(s) to be Shipped:   Inflammatory Disorders: Stelara    Other medication(s) to be shipped: No additional medications requested for fill at this time     Changes to insurance: No    Delivery Scheduled: Yes, Expected medication delivery date: 10/27/2022.     Medication will be delivered via Same Day Courier to the confirmed prescription address in [facility].    The patient will receive a drug information handout for each medication shipped and additional FDA Medication Guides as required.  Verified that patient has previously received a Conservation officer, historic buildings and a Surveyor, mining.    The patient or caregiver noted above participated in the development of this care  plan and knows that they can request review of or adjustments to the care plan at any time.      All of the patient's questions and concerns have been addressed.    [name], PharmD   [facility] Specialty Pharmacist

## 2022-10-20 NOTE — Unmapped (Signed)
[facility] Refill Coordination Note    Specialty Medication(s) to be Shipped:   Inflammatory Disorders: Stelara    Other medication(s) to be shipped: No additional medications requested for fill at this time     Kelsey Nguyen, DOB: Mar 26, 1981  Phone: [phone] (home)       All above HIPAA information was verified with patient.     Was a Nurse, learning disability used for this call? No    Completed refill call assessment today to schedule patient's medication shipment from the [facility] [phone]).  All relevant notes have been reviewed.     Specialty medication(s) and dose(s) confirmed: Regimen is correct and unchanged.   Changes to medications: [name] reports no changes at this time.  Changes to insurance: No  New side effects reported not previously addressed with a pharmacist or physician: None reported  Questions for the pharmacist: No    Confirmed patient received a Conservation officer, historic buildings and a Surveyor, mining with first shipment. The patient will receive a drug information handout for each medication shipped and additional FDA Medication Guides as required.       DISEASE/MEDICATION-SPECIFIC INFORMATION        For patients on injectable medications: Patient currently has 0 doses left.  Next injection is scheduled for 10/27/2022.    SPECIALTY MEDICATION ADHERENCE              Were doses missed due to medication being on hold? No    Stelara 45mg /0.24mL: 0 days of medicine on hand       REFERRAL TO PHARMACIST     Referral to the pharmacist: Not needed      [facility]     Shipping address confirmed in Epic.       Delivery Scheduled: Yes, Expected medication delivery date: 10/27/2022.     Medication will be delivered via Same Day Courier to the temporary address in Epic WAM.    [name], PharmD   [facility] Specialty Pharmacist

## 2022-12-04 ENCOUNTER — Ambulatory Visit (LOCAL_COMMUNITY_HEALTH_CENTER): Payer: Self-pay

## 2022-12-04 DIAGNOSIS — Z227 Latent tuberculosis: Secondary | ICD-10-CM

## 2022-12-04 NOTE — Progress Notes (Signed)
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 Took TB meds. Has no record of this. Thinks she was treated at [facility] in [place].   Patient explains she then had Positive PPD at [facility] 2019, but no record of this with her today.   Per Epic, had chest xray 07/02/2017 and negative.   RN informed her that PPD date and result would be needed to complete TB Screening form. Patient plans to contact [facility] today to obtain ppd results and then will return to [facility] to complete TB Screening form. Patient declined for RN to send ROI to [facility] today for ppd results as she plans to visit [facility] in person for results.   Patient returned to [facility] within one hour with copy of PPD results from [facility]. Copy made and sent for scanning.  PPD placement 06/29/2017 PPDR 07/02/2017 / read at >15 mm / note from [facility] states that [facility], RN ([facility]) read PPD at 20 mm.   TB Screening form completed. Denies all symptoms. Copy given to patient. Questions answered and reports understanding. Jerel Shepherd, RN

## 2023-01-05 DIAGNOSIS — L400 Psoriasis vulgaris: Principal | ICD-10-CM

## 2023-01-14 NOTE — Unmapped (Signed)
[facility] Refill Coordination Note    Specialty Medication(s) to be Shipped:   Inflammatory Disorders: Stelara    Other medication(s) to be shipped: No additional medications requested for fill at this time     Kelsey Nguyen, DOB: [birth year]  Phone: [phone] (home)       All above HIPAA information was verified with patient.     Was a Nurse, learning disability used for this call? No    Completed refill call assessment today to schedule patient's medication shipment from the [facility] [phone]).  All relevant notes have been reviewed.     Specialty medication(s) and dose(s) confirmed: Regimen is correct and unchanged.   Changes to medications: Sanjuanita reports no changes at this time.  Changes to insurance: No  New side effects reported not previously addressed with a pharmacist or physician: None reported  Questions for the pharmacist: No    Confirmed patient received a Conservation officer, historic buildings and a Surveyor, mining with first shipment. The patient will receive a drug information handout for each medication shipped and additional FDA Medication Guides as required.       DISEASE/MEDICATION-SPECIFIC INFORMATION        For patients on injectable medications: Patient currently has 0 doses left.  Next injection is scheduled for 01/21/2023.    SPECIALTY MEDICATION ADHERENCE     Medication Adherence    Patient reported X missed doses in the last month: 0  Specialty Medication: ustekinumab (STELARA) 45 mg/0.5 mL Syrg syringe  Patient is on additional specialty medications: No  Informant: patient              Were doses missed due to medication being on hold? No    Stelara 45mg /0.75mL: 0 days of medicine on hand       REFERRAL TO PHARMACIST     Referral to the pharmacist: Not needed      [facility]     Shipping address confirmed in Epic.       Delivery Scheduled: Yes, Expected medication delivery date: 01/16/2023.     Medication will be delivered via Same Day Courier to the prescription address in Epic WAM.    Alwyn Pea   [facility] Specialty Technician

## 2023-01-16 MED FILL — STELARA 45 MG/0.5 ML SUBCUTANEOUS SYRINGE: 84 days supply | Qty: 0.5 | Fill #1

## 2023-10-05 ENCOUNTER — Inpatient Hospital Stay: Admit: 2023-10-05 | Discharge: 2023-10-06

## 2023-10-05 DIAGNOSIS — M25552 Pain in left hip: Principal | ICD-10-CM

## 2023-10-05 DIAGNOSIS — S99912A Unspecified injury of left ankle, initial encounter: Principal | ICD-10-CM

## 2023-10-06 DIAGNOSIS — L400 Psoriasis vulgaris: Principal | ICD-10-CM

## 2023-10-06 MED ORDER — USTEKINUMAB 45 MG/0.5 ML SUBCUTANEOUS SYRINGE
3 refills | 0.00000 days
Start: 2023-10-06 — End: ?

## 2023-10-07 MED ORDER — USTEKINUMAB 45 MG/0.5 ML SUBCUTANEOUS SYRINGE
SUBCUTANEOUS | 3 refills | 0.00000 days | Status: CP
Start: 2023-10-07 — End: ?

## 2023-10-20 ENCOUNTER — Ambulatory Visit: Admit: 2023-10-20

## 2023-11-11 DIAGNOSIS — M6289 Other specified disorders of muscle: Principal | ICD-10-CM

## 2023-11-11 DIAGNOSIS — M7918 Myalgia, other site: Principal | ICD-10-CM

## 2023-11-11 DIAGNOSIS — R102 Pelvic and perineal pain: Principal | ICD-10-CM

## 2023-11-11 DIAGNOSIS — G8929 Other chronic pain: Principal | ICD-10-CM

## 2023-11-11 MED ORDER — IBUPROFEN 600 MG TABLET
ORAL_TABLET | ORAL | 2 refills | 0.00000 days | Status: CP
Start: 2023-11-11 — End: ?

## 2023-11-19 ENCOUNTER — Ambulatory Visit: Admit: 2023-11-19

## 2023-12-07 ENCOUNTER — Inpatient Hospital Stay: Admit: 2023-12-07 | Discharge: 2023-12-07

## 2023-12-17 ENCOUNTER — Ambulatory Visit: Admit: 2023-12-17

## 2023-12-17 DIAGNOSIS — L988 Other specified disorders of the skin and subcutaneous tissue: Principal | ICD-10-CM

## 2023-12-17 DIAGNOSIS — L650 Telogen effluvium: Principal | ICD-10-CM

## 2023-12-17 DIAGNOSIS — Z79899 Other long term (current) drug therapy: Principal | ICD-10-CM

## 2023-12-17 DIAGNOSIS — L400 Psoriasis vulgaris: Principal | ICD-10-CM

## 2023-12-17 MED ORDER — CLOBETASOL 0.05 % SHAMPOO
Freq: Two times a day (BID) | TOPICAL | 0 refills | 0.00000 days | Status: CP
Start: 2023-12-17 — End: 2024-12-16

## 2023-12-17 MED ORDER — CLOBETASOL 0.05 % TOPICAL OINTMENT
OPHTHALMIC | 5 refills | 0.00000 days | Status: CP
Start: 2023-12-17 — End: ?

## 2023-12-17 MED ORDER — AMMONIUM LACTATE 12 % TOPICAL CREAM
Freq: Every day | TOPICAL | 1 refills | 0.00000 days | Status: CP
Start: 2023-12-17 — End: 2023-12-17

## 2023-12-17 MED ORDER — USTEKINUMAB 45 MG/0.5 ML SUBCUTANEOUS SYRINGE
SUBCUTANEOUS | 3 refills | 90.00000 days | Status: CP
Start: 2023-12-17 — End: ?

## 2023-12-18 DIAGNOSIS — L400 Psoriasis vulgaris: Principal | ICD-10-CM

## 2023-12-21 DIAGNOSIS — L400 Psoriasis vulgaris: Principal | ICD-10-CM

## 2023-12-21 MED ORDER — USTEKINUMAB 45 MG/0.5 ML SUBCUTANEOUS SYRINGE
SUBCUTANEOUS | 3 refills | 90.00000 days | Status: CP
Start: 2023-12-21 — End: ?

## 2023-12-31 ENCOUNTER — Ambulatory Visit: Admit: 2023-12-31

## 2023-12-31 DIAGNOSIS — G8929 Other chronic pain: Principal | ICD-10-CM

## 2023-12-31 DIAGNOSIS — G44209 Tension-type headache, unspecified, not intractable: Principal | ICD-10-CM

## 2023-12-31 DIAGNOSIS — R102 Pelvic and perineal pain: Principal | ICD-10-CM

## 2023-12-31 DIAGNOSIS — G43809 Other migraine, not intractable, without status migrainosus: Principal | ICD-10-CM

## 2023-12-31 DIAGNOSIS — Z7689 Persons encountering health services in other specified circumstances: Principal | ICD-10-CM

## 2023-12-31 MED ORDER — SUMATRIPTAN 50 MG TABLET
ORAL_TABLET | ORAL | 2 refills | 0.00000 days | Status: CP
Start: 2023-12-31 — End: ?

## 2023-12-31 MED ORDER — ONDANSETRON 4 MG DISINTEGRATING TABLET
ORAL_TABLET | Freq: Three times a day (TID) | 0 refills | 7.00000 days | Status: CP | PRN
Start: 2023-12-31 — End: 2024-01-07

## 2024-02-09 ENCOUNTER — Encounter: Admit: 2024-02-09 | Discharge: 2024-02-09 | Attending: Internal Medicine | Primary: Internal Medicine

## 2024-02-09 ENCOUNTER — Inpatient Hospital Stay: Admit: 2024-02-09 | Discharge: 2024-02-09

## 2024-02-09 DIAGNOSIS — R928 Other abnormal and inconclusive findings on diagnostic imaging of breast: Principal | ICD-10-CM

## 2024-02-09 DIAGNOSIS — G43001 Migraine without aura, not intractable, with status migrainosus: Principal | ICD-10-CM

## 2024-02-09 DIAGNOSIS — Z6838 Body mass index (BMI) 38.0-38.9, adult: Principal | ICD-10-CM

## 2024-02-09 DIAGNOSIS — Z131 Encounter for screening for diabetes mellitus: Principal | ICD-10-CM

## 2024-02-09 DIAGNOSIS — F419 Anxiety disorder, unspecified: Principal | ICD-10-CM

## 2024-02-09 DIAGNOSIS — Z1159 Encounter for screening for other viral diseases: Principal | ICD-10-CM

## 2024-02-09 DIAGNOSIS — L400 Psoriasis vulgaris: Principal | ICD-10-CM

## 2024-02-09 DIAGNOSIS — G8929 Other chronic pain: Principal | ICD-10-CM

## 2024-02-09 DIAGNOSIS — Z13220 Encounter for screening for lipoid disorders: Principal | ICD-10-CM

## 2024-02-09 DIAGNOSIS — R102 Chronic pelvic pain in female: Principal | ICD-10-CM

## 2024-02-09 DIAGNOSIS — Z0000 Encounter for general adult medical examination without abnormal findings: Principal | ICD-10-CM

## 2024-02-09 DIAGNOSIS — Z9889 Other specified postprocedural states: Principal | ICD-10-CM

## 2024-02-15 DIAGNOSIS — L400 Psoriasis vulgaris: Principal | ICD-10-CM
